# Patient Record
Sex: Female | Born: 1995 | Race: White | Hispanic: No | Marital: Married | Smoking: Never smoker
Health system: Southern US, Community
[De-identification: ages and names within clinical notes are randomized; demographics above are authoritative.]

## PROBLEM LIST (undated history)

## (undated) DIAGNOSIS — Z789 Other specified health status: Secondary | ICD-10-CM

## (undated) HISTORY — PX: NO PAST SURGERIES: SHX2092

## (undated) HISTORY — DX: Other specified health status: Z78.9

---

## 2018-01-10 ENCOUNTER — Encounter: Payer: Self-pay | Admitting: Women's Health

## 2018-01-10 ENCOUNTER — Ambulatory Visit: Payer: PRIVATE HEALTH INSURANCE | Admitting: Women's Health

## 2018-01-10 VITALS — BP 114/72 | HR 83 | Ht 63.0 in | Wt 207.2 lb

## 2018-01-10 DIAGNOSIS — Z3201 Encounter for pregnancy test, result positive: Secondary | ICD-10-CM

## 2018-01-10 DIAGNOSIS — Z3490 Encounter for supervision of normal pregnancy, unspecified, unspecified trimester: Secondary | ICD-10-CM | POA: Insufficient documentation

## 2018-01-10 DIAGNOSIS — Z3491 Encounter for supervision of normal pregnancy, unspecified, first trimester: Secondary | ICD-10-CM

## 2018-01-10 DIAGNOSIS — R112 Nausea with vomiting, unspecified: Secondary | ICD-10-CM | POA: Diagnosis not present

## 2018-01-10 LAB — POCT URINE PREGNANCY: Preg Test, Ur: POSITIVE — AB

## 2018-01-10 NOTE — Progress Notes (Signed)
   GYN VISIT Patient name: Miranda Campbell MRN [id number]  Date of birth: [birth year] Chief Complaint:   Possible Pregnancy  History of Present Illness:   Miranda Campbell is a 22 y.o. G1P0 Caucasian female at 849w4d by LMP of 11/11/17 being seen today for pregnancy confirmation. Came of COC's to become pregnant. Mild occ n/v. Taking pnv. No illicit drugs. No other meds.      Patient's last menstrual period was 11/11/2017 (exact date). Review of Systems:   Pertinent items are noted in HPI Denies fever/chills, dizziness, headaches, visual disturbances, fatigue, shortness of breath, chest pain, abdominal pain, vomiting, abnormal vaginal discharge/itching/odor/irritation, problems with periods, bowel movements, urination, or intercourse unless otherwise stated above.  Pertinent History Reviewed:  Reviewed past medical,surgical, social, obstetrical and family history.  Reviewed problem list, medications and allergies. Physical Assessment:   Vitals:   01/10/18 1421  BP: 114/72  Pulse: 83  Weight: 207 lb 3.2 oz (94 kg)  Height: 5\' 3"  (1.6 m)  Body mass index is 36.7 kg/m.       Physical Examination:   General appearance: alert, well appearing, and in no distress  Mental status: alert, oriented to person, place, and time  Skin: warm & dry   Cardiovascular: normal heart rate noted  Respiratory: normal respiratory effort, no distress  Abdomen: soft, non-tender   Pelvic: examination not indicated  Extremities: no edema   Results for orders placed or performed in visit on 01/10/18 (from the past 24 hour(s))  POCT urine pregnancy   Collection Time: 01/10/18  2:45 PM  Result Value Ref Range   Preg Test, Ur Positive (A) Negative    Assessment & Plan:  1) 3049w4d pregnant by LMP> dating u/s scheduled for 8/15 @ 9:30am at [facility], be there @ 9:15am. New ob packet given  Meds: No orders of the defined types were placed in this encounter.   Orders Placed This Encounter  Procedures  . US OB Comp Less  14 Wks  . POCT urine pregnancy    Return in about 2 weeks (around 01/24/2018) for intake w/ tish & new ob.  Cheral MarkerKimberly R Mari Battaglia CNM, [facility]NP[facility] 01/10/2018 2:45 PM

## 2018-01-10 NOTE — Patient Instructions (Addendum)
Miranda Campbell, I greatly value your feedback.  If you receive a survey following your visit with [place] today, we appreciate you taking the time to fill it out.  Thanks, [name], CNM, WHNP-BC  Ultrasound Magdalene Molly 8/15 @ 9:30am at [facility], be there at 9:15am   Nausea & Vomiting  Have saltine crackers or pretzels by your bed and eat a few bites before you raise your head out of bed in the morning  Eat small frequent meals throughout the day instead of large meals  Drink plenty of fluids throughout the day to stay hydrated, just don't drink a lot of fluids with your meals.  This can make your stomach fill up faster making you feel sick  Do not brush your teeth right after you eat  Products with real ginger are good for nausea, like ginger ale and ginger hard candy Make sure it says made with real ginger!  Sucking on sour candy like lemon heads is also good for nausea  If your prenatal vitamins make you nauseated, take them at night so you will sleep through the nausea  Sea Bands  If you feel like you need medicine for the nausea & vomiting please let us know  If you are unable to keep any fluids or food down please let us know   Constipation  Drink plenty of fluid, preferably water, throughout the day  Eat foods high in fiber such as fruits, vegetables, and grains  Exercise, such as walking, is a good way to keep your bowels regular  Drink warm fluids, especially warm prune juice, or decaf coffee  Eat a 1/2 cup of real oatmeal (not instant), 1/2 cup applesauce, and 1/2-1 cup warm prune juice every day  If needed, you may take Colace (docusate sodium) stool softener once or twice a day to help keep the stool soft. If you are pregnant, wait until you are out of your first trimester (12-14 weeks of pregnancy)  If you still are having problems with constipation, you may take Miralax once daily as needed to help keep your bowels regular.  If you are pregnant, wait until you are out  of your first trimester (12-14 weeks of pregnancy)   First Trimester of Pregnancy The first trimester of pregnancy is from week 1 until the end of week 12 (months 1 through 3). A week after a sperm fertilizes an egg, the egg will implant on the wall of the uterus. This embryo will begin to develop into a baby. Genes from you and your partner are forming the baby. The female genes determine whether the baby is a boy or a girl. At 6-8 weeks, the eyes and face are formed, and the heartbeat can be seen on ultrasound. At the end of 12 weeks, all the baby's organs are formed.  Now that you are pregnant, you will want to do everything you can to have a healthy baby. Two of the most important things are to get good prenatal care and to follow your health care provider's instructions. Prenatal care is all the medical care you receive before the baby's birth. This care will help prevent, find, and treat any problems during the pregnancy and childbirth. BODY CHANGES Your body goes through many changes during pregnancy. The changes vary from woman to woman.   You may gain or lose a couple of pounds at first.  You may feel sick to your stomach (nauseous) and throw up (vomit). If the vomiting is uncontrollable, call your health  care provider.  You may tire easily.  You may develop headaches that can be relieved by medicines approved by your health care provider.  You may urinate more often. Painful urination may mean you have a bladder infection.  You may develop heartburn as a result of your pregnancy.  You may develop constipation because certain hormones are causing the muscles that push waste through your intestines to slow down.  You may develop hemorrhoids or swollen, bulging veins (varicose veins).  Your breasts may begin to grow larger and become tender. Your nipples may stick out more, and the tissue that surrounds them (areola) may become darker.  Your gums may bleed and may be sensitive to  brushing and flossing.  Dark spots or blotches (chloasma, mask of pregnancy) may develop on your face. This will likely fade after the baby is born.  Your menstrual periods will stop.  You may have a loss of appetite.  You may develop cravings for certain kinds of food.  You may have changes in your emotions from day to day, such as being excited to be pregnant or being concerned that something may go wrong with the pregnancy and baby.  You may have more vivid and strange dreams.  You may have changes in your hair. These can include thickening of your hair, rapid growth, and changes in texture. Some women also have hair loss during or after pregnancy, or hair that feels dry or thin. Your hair will most likely return to normal after your baby is born. WHAT TO EXPECT AT YOUR PRENATAL VISITS During a routine prenatal visit:  You will be weighed to make sure you and the baby are growing normally.  Your blood pressure will be taken.  Your abdomen will be measured to track your baby's growth.  The fetal heartbeat will be listened to starting around week 10 or 12 of your pregnancy.  Test results from any previous visits will be discussed. Your health care provider may ask you:  How you are feeling.  If you are feeling the baby move.  If you have had any abnormal symptoms, such as leaking fluid, bleeding, severe headaches, or abdominal cramping.  If you have any questions. Other tests that may be performed during your first trimester include:  Blood tests to find your blood type and to check for the presence of any previous infections. They will also be used to check for low iron levels (anemia) and Rh antibodies. Later in the pregnancy, blood tests for diabetes will be done along with other tests if problems develop.  Urine tests to check for infections, diabetes, or protein in the urine.  An ultrasound to confirm the proper growth and development of the baby.  An amniocentesis  to check for possible genetic problems.  Fetal screens for spina bifida and Down syndrome.  You may need other tests to make sure you and the baby are doing well. HOME CARE INSTRUCTIONS  Medicines  Follow your health care provider's instructions regarding medicine use. Specific medicines may be either safe or unsafe to take during pregnancy.  Take your prenatal vitamins as directed.  If you develop constipation, try taking a stool softener if your health care provider approves. Diet  Eat regular, well-balanced meals. Choose a variety of foods, such as meat or vegetable-based protein, fish, milk and low-fat dairy products, vegetables, fruits, and whole grain breads and cereals. Your health care provider will help you determine the amount of weight gain that is right for you.  Avoid raw meat and uncooked cheese. These carry germs that can cause birth defects in the baby.  Eating four or five small meals rather than three large meals a day may help relieve nausea and vomiting. If you start to feel nauseous, eating a few soda crackers can be helpful. Drinking liquids between meals instead of during meals also seems to help nausea and vomiting.  If you develop constipation, eat more high-fiber foods, such as fresh vegetables or fruit and whole grains. Drink enough fluids to keep your urine clear or pale yellow. Activity and Exercise  Exercise only as directed by your health care provider. Exercising will help you:  Control your weight.  Stay in shape.  Be prepared for labor and delivery.  Experiencing pain or cramping in the lower abdomen or low back is a good sign that you should stop exercising. Check with your health care provider before continuing normal exercises.  Try to avoid standing for long periods of time. Move your legs often if you must stand in one place for a long time.  Avoid heavy lifting.  Wear low-heeled shoes, and practice good posture.  You may continue to have  sex unless your health care provider directs you otherwise. Relief of Pain or Discomfort  Wear a good support bra for breast tenderness.   Take warm sitz baths to soothe any pain or discomfort caused by hemorrhoids. Use hemorrhoid cream if your health care provider approves.   Rest with your legs elevated if you have leg cramps or low back pain.  If you develop varicose veins in your legs, wear support hose. Elevate your feet for 15 minutes, 3-4 times a day. Limit salt in your diet. Prenatal Care  Schedule your prenatal visits by the twelfth week of pregnancy. They are usually scheduled monthly at first, then more often in the last 2 months before delivery.  Write down your questions. Take them to your prenatal visits.  Keep all your prenatal visits as directed by your health care provider. Safety  Wear your seat belt at all times when driving.  Make a list of emergency phone numbers, including numbers for family, friends, the hospital, and police and fire departments. General Tips  Ask your health care provider for a referral to a local prenatal education class. Begin classes no later than at the beginning of month 6 of your pregnancy.  Ask for help if you have counseling or nutritional needs during pregnancy. Your health care provider can offer advice or refer you to specialists for help with various needs.  Do not use hot tubs, steam rooms, or saunas.  Do not douche or use tampons or scented sanitary pads.  Do not cross your legs for long periods of time.  Avoid cat litter boxes and soil used by cats. These carry germs that can cause birth defects in the baby and possibly loss of the fetus by miscarriage or stillbirth.  Avoid all smoking, herbs, alcohol, and medicines not prescribed by your health care provider. Chemicals in these affect the formation and growth of the baby.  Schedule a dentist appointment. At home, brush your teeth with a soft toothbrush and be gentle when  you floss. SEEK MEDICAL CARE IF:   You have dizziness.  You have mild pelvic cramps, pelvic pressure, or nagging pain in the abdominal area.  You have persistent nausea, vomiting, or diarrhea.  You have a bad smelling vaginal discharge.  You have pain with urination.  You notice increased swelling in  your face, hands, legs, or ankles. SEEK IMMEDIATE MEDICAL CARE IF:   You have a fever.  You are leaking fluid from your vagina.  You have spotting or bleeding from your vagina.  You have severe abdominal cramping or pain.  You have rapid weight gain or loss.  You vomit blood or material that looks like coffee grounds.  You are exposed to [place] measles and have never had them.  You are exposed to fifth disease or chickenpox.  You develop a severe headache.  You have shortness of breath.  You have any kind of trauma, such as from a fall or a car accident. Document Released: 05/16/2001 Document Revised: 10/06/2013 Document Reviewed: 04/01/2013 [employer] Patient Information 2015 [place], [place]. This information is not intended to replace advice given to you by your health care provider. Make sure you discuss any questions you have with your health care provider.

## 2018-01-17 ENCOUNTER — Ambulatory Visit (HOSPITAL_COMMUNITY)
Admission: RE | Admit: 2018-01-17 | Discharge: 2018-01-17 | Disposition: A | Discharge status: Home / Self Care | Payer: PRIVATE HEALTH INSURANCE | Source: Ambulatory Visit | Attending: Women's Health | Admitting: Women's Health

## 2018-01-17 DIAGNOSIS — Z3491 Encounter for supervision of normal pregnancy, unspecified, first trimester: Secondary | ICD-10-CM | POA: Diagnosis not present

## 2018-01-28 ENCOUNTER — Ambulatory Visit (INDEPENDENT_AMBULATORY_CARE_PROVIDER_SITE_OTHER): Payer: PRIVATE HEALTH INSURANCE | Admitting: Women's Health

## 2018-01-28 ENCOUNTER — Ambulatory Visit: Payer: PRIVATE HEALTH INSURANCE | Admitting: *Deleted

## 2018-01-28 ENCOUNTER — Encounter: Payer: Self-pay | Admitting: Women's Health

## 2018-01-28 VITALS — BP 119/75 | HR 75 | Wt 201.5 lb

## 2018-01-28 DIAGNOSIS — F419 Anxiety disorder, unspecified: Secondary | ICD-10-CM

## 2018-01-28 DIAGNOSIS — Z3A11 11 weeks gestation of pregnancy: Secondary | ICD-10-CM

## 2018-01-28 DIAGNOSIS — Z1389 Encounter for screening for other disorder: Secondary | ICD-10-CM

## 2018-01-28 DIAGNOSIS — Z3401 Encounter for supervision of normal first pregnancy, first trimester: Secondary | ICD-10-CM

## 2018-01-28 DIAGNOSIS — Z8279 Family history of other congenital malformations, deformations and chromosomal abnormalities: Secondary | ICD-10-CM

## 2018-01-28 DIAGNOSIS — Z3400 Encounter for supervision of normal first pregnancy, unspecified trimester: Secondary | ICD-10-CM | POA: Insufficient documentation

## 2018-01-28 DIAGNOSIS — Z3682 Encounter for antenatal screening for nuchal translucency: Secondary | ICD-10-CM

## 2018-01-28 DIAGNOSIS — Z331 Pregnant state, incidental: Secondary | ICD-10-CM

## 2018-01-28 LAB — POCT URINALYSIS DIPSTICK OB
GLUCOSE, UA: NEGATIVE — AB
Ketones, UA: NEGATIVE
Leukocytes, UA: NEGATIVE
Nitrite, UA: NEGATIVE
POC,PROTEIN,UA: NEGATIVE
RBC UA: NEGATIVE

## 2018-01-28 NOTE — Progress Notes (Signed)
INITIAL OBSTETRICAL VISIT Patient name: Miranda Campbell MRN [id number]  Date of birth: 02-21-1996 Chief Complaint:   Initial Prenatal Visit  History of Present Illness:   Miranda Campbell is a 22 y.o. G1P0 Caucasian female at [redacted]w[redacted]d by LMP c/w outside 8wk u/s, with an Estimated Date of Delivery: 08/18/18 being seen today for her initial obstetrical visit.   Her obstetrical history is significant for primigravida.   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 Hip does not pop out of joint, just pops occ when walking.  FOB's 1st cousin died w/ Fragile X H/O anxiety- no meds, doing well Patient's last menstrual period was 11/11/2017 (exact date). Last pap 2018 [place]. Results were: normal Review of Systems:   Pertinent items are noted in HPI Denies cramping/contractions, leakage of fluid, vaginal bleeding, abnormal vaginal discharge w/ itching/odor/irritation, headaches, visual changes, shortness of breath, chest pain, abdominal pain, severe nausea/vomiting, or problems with urination or bowel movements unless otherwise stated above.  Pertinent History Reviewed:  Reviewed past medical,surgical, social, obstetrical and family history.  Reviewed problem list, medications and allergies. OB History  Gravida Para Term Preterm AB Living  1            SAB TAB Ectopic Multiple Live Births               # Outcome Date GA Lbr Len/2nd Weight Sex Delivery Anes PTL Lv  1 Current            Physical Assessment:   Vitals:   01/28/18 1509  BP: 119/75  Pulse: 75  Weight: 201 lb 8 oz (91.4 kg)  Body mass index is 35.69 kg/m.       Physical Examination:  General appearance - well appearing, and in no distress  Mental status - alert, oriented to person, place, and time  Psych:  She has a normal mood and affect  Skin - warm and dry, normal color, no suspicious lesions noted  Chest - effort normal, all lung fields clear to auscultation  bilaterally  Heart - normal rate and regular rhythm  Abdomen - soft, nontender  Extremities:  No swelling or varicosities noted  Thin prep pap is not done    Fetal Heart Rate (bpm): +u/s via informal transabdominal u/s  Results for orders placed or performed in visit on 01/28/18 (from the past 24 hour(s))  POC Urinalysis Dipstick OB   Collection Time: 01/28/18  3:09 PM  Result Value Ref Range   Color, UA     Clarity, UA     Glucose, UA Negative (A) (none)   Bilirubin, UA     Ketones, UA neg    Spec Grav, UA     Blood, UA neg    pH, UA     POC Protein UA Negative Negative, Trace   Urobilinogen, UA     Nitrite, UA neg    Leukocytes, UA Negative Negative   Appearance     Odor      Assessment & Plan:  1) Low-Risk Pregnancy G1P0 at [redacted]w[redacted]d with an Estimated Date of Delivery: 08/18/18   2) Initial OB visit  3) FOB's 1st cousin died w/ Fragile X> offered pt testing, wants to think/check insurance, will let us know  4) Anxiety> no meds, doing well  Meds: No orders of the defined types were placed in this encounter.  Initial labs obtained Continue prenatal vitamins Reviewed n/v relief measures and warning s/s to report  Reviewed recommended weight gain based on pre-gravid BMI Encouraged well-balanced diet Genetic Screening discussed Integrated Screen: requested Cystic fibrosis screening discussed declined Ultrasound discussed; fetal survey: requested CCNC completed>not applying for preg mcaid  Follow-up: Return in about 2 weeks (around 02/11/2018) for US:NT+1stIT, LROB.   Orders Placed This Encounter  Procedures  . GC/Chlamydia Probe Amp  . Urine Culture  . US Fetal Nuchal Translucency Measurement  . Urinalysis, Routine w reflex microscopic  . Obstetric Panel, Including HIV  . Pain Management Screening Profile (10S)  . POC Urinalysis Dipstick OB    Cheral MarkerKimberly R Tzippy Testerman CNM, [facility]NP-BC 01/28/2018 4:01 PM

## 2018-01-28 NOTE — Patient Instructions (Signed)
Miranda PaganEmily Minerva, I greatly value your feedback.  If you receive a survey following your visit with us today, we appreciate you taking the time to fill it out.  Thanks, Joellyn HaffKim Berenice Oehlert, CNM, WHNP-BC   Nausea & Vomiting  Have saltine crackers or pretzels by your bed and eat a few bites before you raise your head out of bed in the morning  Eat small frequent meals throughout the day instead of large meals  Drink plenty of fluids throughout the day to stay hydrated, just don't drink a lot of fluids with your meals.  This can make your stomach fill up faster making you feel sick  Do not brush your teeth right after you eat  Products with real ginger are good for nausea, like ginger ale and ginger hard candy Make sure it says made with real ginger!  Sucking on sour candy like lemon heads is also good for nausea  If your prenatal vitamins make you nauseated, take them at night so you will sleep through the nausea  Sea Bands  If you feel like you need medicine for the nausea & vomiting please let us know  If you are unable to keep any fluids or food down please let us know   Constipation  Drink plenty of fluid, preferably water, throughout the day  Eat foods high in fiber such as fruits, vegetables, and grains  Exercise, such as walking, is a good way to keep your bowels regular  Drink warm fluids, especially warm prune juice, or decaf coffee  Eat a 1/2 cup of real oatmeal (not instant), 1/2 cup applesauce, and 1/2-1 cup warm prune juice every day  If needed, you may take Colace (docusate sodium) stool softener once or twice a day to help keep the stool soft. If you are pregnant, wait until you are out of your first trimester (12-14 weeks of pregnancy)  If you still are having problems with constipation, you may take Miralax once daily as needed to help keep your bowels regular.  If you are pregnant, wait until you are out of your first trimester (12-14 weeks of pregnancy)   First  Trimester of Pregnancy The first trimester of pregnancy is from week 1 until the end of week 12 (months 1 through 3). A week after a sperm fertilizes an egg, the egg will implant on the wall of the uterus. This embryo will begin to develop into a baby. Genes from you and your partner are forming the baby. The female genes determine whether the baby is a boy or a girl. At 6-8 weeks, the eyes and face are formed, and the heartbeat can be seen on ultrasound. At the end of 12 weeks, all the baby's organs are formed.  Now that you are pregnant, you will want to do everything you can to have a healthy baby. Two of the most important things are to get good prenatal care and to follow your health care provider's instructions. Prenatal care is all the medical care you receive before the baby's birth. This care will help prevent, find, and treat any problems during the pregnancy and childbirth. BODY CHANGES Your body goes through many changes during pregnancy. The changes vary from woman to woman.   You may gain or lose a couple of pounds at first.  You may feel sick to your stomach (nauseous) and throw up (vomit). If the vomiting is uncontrollable, call your health care provider.  You may tire easily.  You may develop headaches that  can be relieved by medicines approved by your health care provider.  You may urinate more often. Painful urination may mean you have a bladder infection.  You may develop heartburn as a result of your pregnancy.  You may develop constipation because certain hormones are causing the muscles that push waste through your intestines to slow down.  You may develop hemorrhoids or swollen, bulging veins (varicose veins).  Your breasts may begin to grow larger and become tender. Your nipples may stick out more, and the tissue that surrounds them (areola) may become darker.  Your gums may bleed and may be sensitive to brushing and flossing.  Dark spots or blotches (chloasma, mask  of pregnancy) may develop on your face. This will likely fade after the baby is born.  Your menstrual periods will stop.  You may have a loss of appetite.  You may develop cravings for certain kinds of food.  You may have changes in your emotions from day to day, such as being excited to be pregnant or being concerned that something may go wrong with the pregnancy and baby.  You may have more vivid and strange dreams.  You may have changes in your hair. These can include thickening of your hair, rapid growth, and changes in texture. Some women also have hair loss during or after pregnancy, or hair that feels dry or thin. Your hair will most likely return to normal after your baby is born. WHAT TO EXPECT AT YOUR PRENATAL VISITS During a routine prenatal visit:  You will be weighed to make sure you and the baby are growing normally.  Your blood pressure will be taken.  Your abdomen will be measured to track your baby's growth.  The fetal heartbeat will be listened to starting around week 10 or 12 of your pregnancy.  Test results from any previous visits will be discussed. Your health care provider may ask you:  How you are feeling.  If you are feeling the baby move.  If you have had any abnormal symptoms, such as leaking fluid, bleeding, severe headaches, or abdominal cramping.  If you have any questions. Other tests that may be performed during your first trimester include:  Blood tests to find your blood type and to check for the presence of any previous infections. They will also be used to check for low iron levels (anemia) and Rh antibodies. Later in the pregnancy, blood tests for diabetes will be done along with other tests if problems develop.  Urine tests to check for infections, diabetes, or protein in the urine.  An ultrasound to confirm the proper growth and development of the baby.  An amniocentesis to check for possible genetic problems.  Fetal screens for spina  bifida and Down syndrome.  You may need other tests to make sure you and the baby are doing well. HOME CARE INSTRUCTIONS  Medicines  Follow your health care provider's instructions regarding medicine use. Specific medicines may be either safe or unsafe to take during pregnancy.  Take your prenatal vitamins as directed.  If you develop constipation, try taking a stool softener if your health care provider approves. Diet  Eat regular, well-balanced meals. Choose a variety of foods, such as meat or vegetable-based protein, fish, milk and low-fat dairy products, vegetables, fruits, and whole grain breads and cereals. Your health care provider will help you determine the amount of weight gain that is right for you.  Avoid raw meat and uncooked cheese. These carry germs that can cause  birth defects in the baby.  Eating four or five small meals rather than three large meals a day may help relieve nausea and vomiting. If you start to feel nauseous, eating a few soda crackers can be helpful. Drinking liquids between meals instead of during meals also seems to help nausea and vomiting.  If you develop constipation, eat more high-fiber foods, such as fresh vegetables or fruit and whole grains. Drink enough fluids to keep your urine clear or pale yellow. Activity and Exercise  Exercise only as directed by your health care provider. Exercising will help you:  Control your weight.  Stay in shape.  Be prepared for labor and delivery.  Experiencing pain or cramping in the lower abdomen or low back is a good sign that you should stop exercising. Check with your health care provider before continuing normal exercises.  Try to avoid standing for long periods of time. Move your legs often if you must stand in one place for a long time.  Avoid heavy lifting.  Wear low-heeled shoes, and practice good posture.  You may continue to have sex unless your health care provider directs you  otherwise. Relief of Pain or Discomfort  Wear a good support bra for breast tenderness.   Take warm sitz baths to soothe any pain or discomfort caused by hemorrhoids. Use hemorrhoid cream if your health care provider approves.   Rest with your legs elevated if you have leg cramps or low back pain.  If you develop varicose veins in your legs, wear support hose. Elevate your feet for 15 minutes, 3-4 times a day. Limit salt in your diet. Prenatal Care  Schedule your prenatal visits by the twelfth week of pregnancy. They are usually scheduled monthly at first, then more often in the last 2 months before delivery.  Write down your questions. Take them to your prenatal visits.  Keep all your prenatal visits as directed by your health care provider. Safety  Wear your seat belt at all times when driving.  Make a list of emergency phone numbers, including numbers for family, friends, the hospital, and police and fire departments. General Tips  Ask your health care provider for a referral to a local prenatal education class. Begin classes no later than at the beginning of month 6 of your pregnancy.  Ask for help if you have counseling or nutritional needs during pregnancy. Your health care provider can offer advice or refer you to specialists for help with various needs.  Do not use hot tubs, steam rooms, or saunas.  Do not douche or use tampons or scented sanitary pads.  Do not cross your legs for long periods of time.  Avoid cat litter boxes and soil used by cats. These carry germs that can cause birth defects in the baby and possibly loss of the fetus by miscarriage or stillbirth.  Avoid all smoking, herbs, alcohol, and medicines not prescribed by your health care provider. Chemicals in these affect the formation and growth of the baby.  Schedule a dentist appointment. At home, brush your teeth with a soft toothbrush and be gentle when you floss. SEEK MEDICAL CARE IF:   You have  dizziness.  You have mild pelvic cramps, pelvic pressure, or nagging pain in the abdominal area.  You have persistent nausea, vomiting, or diarrhea.  You have a bad smelling vaginal discharge.  You have pain with urination.  You notice increased swelling in your face, hands, legs, or ankles. SEEK IMMEDIATE MEDICAL CARE IF:  You have a fever.  You are leaking fluid from your vagina.  You have spotting or bleeding from your vagina.  You have severe abdominal cramping or pain.  You have rapid weight gain or loss.  You vomit blood or material that looks like coffee grounds.  You are exposed to [place] measles and have never had them.  You are exposed to fifth disease or chickenpox.  You develop a severe headache.  You have shortness of breath.  You have any kind of trauma, such as from a fall or a car accident. Document Released: 05/16/2001 Document Revised: 10/06/2013 Document Reviewed: 04/01/2013 [facility] Patient Information 2015 [place], [place]. This information is not intended to replace advice given to you by your health care provider. Make sure you discuss any questions you have with your health care provider.

## 2018-01-29 LAB — PMP SCREEN PROFILE (10S), URINE
AMPHETAMINE SCREEN URINE: NEGATIVE ng/mL
BARBITURATE SCREEN URINE: NEGATIVE ng/mL
BENZODIAZEPINE SCREEN, URINE: NEGATIVE ng/mL
CANNABINOIDS UR QL SCN: NEGATIVE ng/mL
COCAINE(METAB.)SCREEN, URINE: NEGATIVE ng/mL
Creatinine(Crt), U: 29.9 mg/dL (ref 20.0–300.0)
Methadone Screen, Urine: NEGATIVE ng/mL
OPIATE SCREEN URINE: NEGATIVE ng/mL
OXYCODONE+OXYMORPHONE UR QL SCN: NEGATIVE ng/mL
PHENCYCLIDINE QUANTITATIVE URINE: NEGATIVE ng/mL
Ph of Urine: 7.1 (ref 4.5–8.9)
Propoxyphene Scrn, Ur: NEGATIVE ng/mL

## 2018-01-30 LAB — OBSTETRIC PANEL, INCLUDING HIV
Antibody Screen: NEGATIVE
BASOS ABS: 0 10*3/uL (ref 0.0–0.2)
Basos: 0 %
EOS (ABSOLUTE): 0 10*3/uL (ref 0.0–0.4)
Eos: 0 %
HIV SCREEN 4TH GENERATION: NONREACTIVE
Hematocrit: 41.6 % (ref 34.0–46.6)
Hemoglobin: 14.3 g/dL (ref 11.1–15.9)
Hepatitis B Surface Ag: NEGATIVE
IMMATURE GRANULOCYTES: 0 %
Immature Grans (Abs): 0 10*3/uL (ref 0.0–0.1)
Lymphocytes Absolute: 1.9 10*3/uL (ref 0.7–3.1)
Lymphs: 16 %
MCH: 30.5 pg (ref 26.6–33.0)
MCHC: 34.4 g/dL (ref 31.5–35.7)
MCV: 89 fL (ref 79–97)
Monocytes Absolute: 0.7 10*3/uL (ref 0.1–0.9)
Monocytes: 6 %
NEUTROS ABS: 9.3 10*3/uL — AB (ref 1.4–7.0)
NEUTROS PCT: 78 %
PLATELETS: 325 10*3/uL (ref 150–450)
RBC: 4.69 x10E6/uL (ref 3.77–5.28)
RDW: 13.6 % (ref 12.3–15.4)
RPR Ser Ql: NONREACTIVE
Rh Factor: POSITIVE
Rubella Antibodies, IGG: 1.24 index (ref >0.99)
WBC: 12 10*3/uL — AB (ref 3.4–10.8)

## 2018-01-30 LAB — URINE CULTURE

## 2018-01-30 LAB — URINALYSIS, ROUTINE W REFLEX MICROSCOPIC
Bilirubin, UA: NEGATIVE
Glucose, UA: NEGATIVE
KETONES UA: NEGATIVE
Leukocytes, UA: NEGATIVE
NITRITE UA: NEGATIVE
Protein, UA: NEGATIVE
RBC UA: NEGATIVE
SPEC GRAV UA: 1.006 (ref 1.005–1.030)
Urobilinogen, Ur: 0.2 mg/dL (ref 0.2–1.0)
pH, UA: 7 (ref 5.0–7.5)

## 2018-01-30 LAB — GC/CHLAMYDIA PROBE AMP
Chlamydia trachomatis, NAA: NEGATIVE
Neisseria gonorrhoeae by PCR: NEGATIVE

## 2018-02-13 ENCOUNTER — Ambulatory Visit (INDEPENDENT_AMBULATORY_CARE_PROVIDER_SITE_OTHER): Payer: PRIVATE HEALTH INSURANCE | Admitting: Obstetrics and Gynecology

## 2018-02-13 ENCOUNTER — Other Ambulatory Visit: Payer: Self-pay

## 2018-02-13 ENCOUNTER — Encounter: Payer: Self-pay | Admitting: Obstetrics and Gynecology

## 2018-02-13 ENCOUNTER — Ambulatory Visit (INDEPENDENT_AMBULATORY_CARE_PROVIDER_SITE_OTHER): Payer: PRIVATE HEALTH INSURANCE

## 2018-02-13 VITALS — BP 122/73 | HR 70 | Wt 198.8 lb

## 2018-02-13 DIAGNOSIS — Z3A13 13 weeks gestation of pregnancy: Secondary | ICD-10-CM

## 2018-02-13 DIAGNOSIS — Z3682 Encounter for antenatal screening for nuchal translucency: Secondary | ICD-10-CM | POA: Diagnosis not present

## 2018-02-13 DIAGNOSIS — Z1389 Encounter for screening for other disorder: Secondary | ICD-10-CM

## 2018-02-13 DIAGNOSIS — Z3401 Encounter for supervision of normal first pregnancy, first trimester: Secondary | ICD-10-CM

## 2018-02-13 DIAGNOSIS — Z331 Pregnant state, incidental: Secondary | ICD-10-CM

## 2018-02-13 LAB — POCT URINALYSIS DIPSTICK OB
Blood, UA: NEGATIVE
Glucose, UA: NEGATIVE
Ketones, UA: NEGATIVE
LEUKOCYTES UA: NEGATIVE
NITRITE UA: NEGATIVE
PROTEIN: NEGATIVE

## 2018-02-13 NOTE — Progress Notes (Signed)
Patient ID: [name], female   DOB: August 19, 1995, 22 y.o.   MRN: [id number]    LOW-RISK PREGNANCY VISIT Patient name: Miranda Campbell MRN [id number]  Date of birth: 1996/02/16 Chief Complaint:   Routine Prenatal Visit (u/s today)  History of Present Illness:   Miranda Campbell is a 22 y.o. G1P0 female at [redacted]w[redacted]d with an Estimated Date of Delivery: 08/18/18 being seen today for ongoing management of a low-risk pregnancy. 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 Today she reports no complaints.  .  .   . denies leaking of fluid. Review of Systems:   Pertinent items are noted in HPI Denies abnormal vaginal discharge w/ itching/odor/irritation, headaches, visual changes, shortness of breath, chest pain, abdominal pain, severe nausea/vomiting, or problems with urination or bowel movements unless otherwise stated above. Pertinent History Reviewed:  Reviewed past medical,surgical, social, obstetrical and family history.  Reviewed problem list, medications and allergies. Physical Assessment:  There were no vitals filed for this visit.There is no height or weight on file to calculate BMI.        Physical Examination:   General appearance: Well appearing, and in no distress  Mental status: Alert, oriented to person, place, and time  Skin: Warm & dry  Cardiovascular: Normal heart rate noted  Respiratory: Normal respiratory effort, no distress  Abdomen: Soft, gravid, nontender  Pelvic: Cervical exam deferred         Extremities:    Fetal Status:          No results found for this or any previous visit (from the past 24 hour(s)).  Assessment & Plan:  1) Low-risk pregnancy G1P0 at [redacted]w[redacted]d with an Estimated Date of Delivery: 08/18/18    Meds: No orders of the defined types were placed in this encounter.  Labs/procedures today: u/s  Plan:  Continue routine obstetrical care 4 weeks for routine LROB visit  Follow-up: No follow-ups on file.  Orders Placed This Encounter  Procedures  . POC  Urinalysis Dipstick OB   By signing my name below, I, Arnette Norris, attest that this documentation has been prepared under the direction and in the presence of Tilda Burrow, MD. Electronically Signed: Arnette Norris Medical Scribe. 02/13/18. 3:34 PM.  I personally performed the services described in this documentation, which was SCRIBED in my presence. The recorded information has been reviewed and considered accurate. It has been edited as necessary during review. Tilda Burrow, MD

## 2018-02-13 NOTE — Progress Notes (Signed)
[place] 12+3 wks,measurements c/w dates,crl 63.92 mm,normal ovaries bilat,NB present,NT 1.8 mm,fhr 159 bpm

## 2018-02-15 LAB — INTEGRATED 1
CROWN RUMP LENGTH MAT SCREEN: 63.9 mm
Gest. Age on Collection Date: 12.6 weeks
Maternal Age at EDD: 23.1 yr
Nuchal Translucency (NT): 1.8 mm
Number of Fetuses: 1
PAPP-A Value: 896.2 ng/mL
Weight: 199 [lb_av]

## 2018-03-13 ENCOUNTER — Encounter: Payer: Self-pay | Admitting: Obstetrics and Gynecology

## 2018-03-13 ENCOUNTER — Other Ambulatory Visit: Payer: Self-pay

## 2018-03-13 ENCOUNTER — Ambulatory Visit (INDEPENDENT_AMBULATORY_CARE_PROVIDER_SITE_OTHER): Payer: PRIVATE HEALTH INSURANCE | Admitting: Obstetrics and Gynecology

## 2018-03-13 VITALS — BP 125/75 | HR 90 | Wt 195.8 lb

## 2018-03-13 DIAGNOSIS — Z331 Pregnant state, incidental: Secondary | ICD-10-CM

## 2018-03-13 DIAGNOSIS — Z1389 Encounter for screening for other disorder: Secondary | ICD-10-CM

## 2018-03-13 DIAGNOSIS — Z3402 Encounter for supervision of normal first pregnancy, second trimester: Secondary | ICD-10-CM

## 2018-03-13 DIAGNOSIS — Z3A16 16 weeks gestation of pregnancy: Secondary | ICD-10-CM

## 2018-03-13 DIAGNOSIS — Z1379 Encounter for other screening for genetic and chromosomal anomalies: Secondary | ICD-10-CM

## 2018-03-13 LAB — POCT URINALYSIS DIPSTICK OB
Glucose, UA: NEGATIVE
Ketones, UA: NEGATIVE
LEUKOCYTES UA: NEGATIVE
NITRITE UA: NEGATIVE
PROTEIN: NEGATIVE
RBC UA: NEGATIVE

## 2018-03-13 NOTE — Progress Notes (Signed)
Patient ID: Miranda Campbell, female   DOB: September 02, 1995, 22 y.o.   MRN: [id number]    LOW-RISK PREGNANCY VISIT Patient name: Miranda Campbell MRN [id number]  Date of birth: 04-Dec-1995 Chief Complaint:   Routine Prenatal Visit (2nd IT)  History of Present Illness:   Miranda Campbell is a 22 y.o. G1P0 female at [redacted]w[redacted]d with an Estimated Date of Delivery: 08/25/18 being seen today for ongoing management of a low-risk pregnancy.  Today she reports no complaints.  . Vag. Bleeding: None.   . denies leaking of fluid. Review of Systems:   Pertinent items are noted in HPI Denies abnormal vaginal discharge w/ itching/odor/irritation, headaches, visual changes, shortness of breath, chest pain, abdominal pain, severe nausea/vomiting, or problems with urination or bowel movements unless otherwise stated above. Pertinent History Reviewed:  Reviewed past medical,surgical, social, obstetrical and family history.  Reviewed problem list, medications and allergies. Physical Assessment:   Vitals:   03/13/18 1501  BP: 125/75  Pulse: 90  Weight: 195 lb 12.8 oz (88.8 kg)  Body mass index is 34.68 kg/m.        Physical Examination:   General appearance: Well appearing, and in no distress  Mental status: Alert, oriented to person, place, and time  Skin: Warm & dry  Cardiovascular: Normal heart rate noted  Respiratory: Normal respiratory effort, no distress  Abdomen: Soft, gravid, nontender  Pelvic: Cervical exam performed         Extremities: Edema: None  Fetal Status: Fetal Heart Rate (bpm): 150        Results for orders placed or performed in visit on 03/13/18 (from the past 24 hour(s))  POC Urinalysis Dipstick OB   Collection Time: 03/13/18  3:00 PM  Result Value Ref Range   Color, UA     Clarity, UA     Glucose, UA Negative Negative   Bilirubin, UA     Ketones, UA neg    Spec Grav, UA     Blood, UA neg    pH, UA     POC Protein UA Negative Negative, Trace   Urobilinogen, UA     Nitrite, UA neg     Leukocytes, UA Negative Negative   Appearance     Odor      Assessment & Plan:  1) Low-risk pregnancy G1P0 at [redacted]w[redacted]d with an Estimated Date of Delivery: 08/25/18     Meds: No orders of the defined types were placed in this encounter.  Labs/procedures today: 2nd ITs  Plan:   1. Continue routine obstetrical care  2. F/u in 4 weeks for routine LROB  Follow-up: No follow-ups on file.  Orders Placed This Encounter  Procedures  . INTEGRATED 2  . POC Urinalysis Dipstick OB   By signing my name below, I, Arnette Norris, attest that this documentation has been prepared under the direction and in the presence of Tilda Burrow, MD. Electronically Signed: Arnette Norris Medical Scribe. 03/13/18. 3:23 PM.  I personally performed the services described in this documentation, which was SCRIBED in my presence. The recorded information has been reviewed and considered accurate. It has been edited as necessary during review. Tilda Burrow, MD

## 2018-03-15 LAB — INTEGRATED 2
AFP MARKER: 30.1 ng/mL
AFP MOM: 1.1
Crown Rump Length: 63.9 mm
DIA MOM: 0.67
DIA Value: 93.6 pg/mL
ESTRIOL UNCONJUGATED: 1.21 ng/mL
Gest. Age on Collection Date: 12.6 weeks
Gestational Age: 16.6 weeks
MATERNAL AGE AT EDD: 23.1 a
NUMBER OF FETUSES: 1
Nuchal Translucency (NT): 1.8 mm
Nuchal Translucency MoM: 1.26
PAPP-A MoM: 1.32
PAPP-A Value: 896.2 ng/mL
Test Results:: NEGATIVE
WEIGHT: 199 [lb_av]
Weight: 199 [lb_av]
hCG MoM: 0.52
hCG Value: 13.8 IU/mL
uE3 MoM: 1.33

## 2018-04-10 ENCOUNTER — Ambulatory Visit (INDEPENDENT_AMBULATORY_CARE_PROVIDER_SITE_OTHER): Payer: PRIVATE HEALTH INSURANCE | Admitting: Advanced Practice Midwife

## 2018-04-10 VITALS — BP 110/69 | HR 78 | Wt 198.0 lb

## 2018-04-10 DIAGNOSIS — Z331 Pregnant state, incidental: Secondary | ICD-10-CM

## 2018-04-10 DIAGNOSIS — Z3A20 20 weeks gestation of pregnancy: Secondary | ICD-10-CM

## 2018-04-10 DIAGNOSIS — Z1389 Encounter for screening for other disorder: Secondary | ICD-10-CM

## 2018-04-10 DIAGNOSIS — Z3402 Encounter for supervision of normal first pregnancy, second trimester: Secondary | ICD-10-CM

## 2018-04-10 DIAGNOSIS — Z363 Encounter for antenatal screening for malformations: Secondary | ICD-10-CM

## 2018-04-10 LAB — POCT URINALYSIS DIPSTICK OB
Blood, UA: NEGATIVE
Glucose, UA: NEGATIVE
Ketones, UA: NEGATIVE
LEUKOCYTES UA: NEGATIVE
Nitrite, UA: NEGATIVE
PROTEIN: NEGATIVE

## 2018-04-10 NOTE — Progress Notes (Signed)
   LOW-RISK PREGNANCY VISIT Patient name: Miranda Campbell MRN [id number]  Date of birth: 07-27-1995 Chief Complaint:   Routine Prenatal Visit  History of Present Illness:   Miranda Campbell is a 22 y.o. G1P0 female at [redacted]w[redacted]d with an Estimated Date of Delivery: 08/25/18 being seen today for ongoing management of a low-risk pregnancy.  Today she reports no complaints. Contractions: Not present. Vag. Bleeding: None.  Movement: Absent. denies leaking of fluid. Review of Systems:   Pertinent items are noted in HPI Denies abnormal vaginal discharge w/ itching/odor/irritation, headaches, visual changes, shortness of breath, chest pain, abdominal pain, severe nausea/vomiting, or problems with urination or bowel movements unless otherwise stated above.  Pertinent History Reviewed:  Medical & Surgical Hx:   Past Medical History:  Diagnosis Date  . Medical history non-contributory    Past Surgical History:  Procedure Laterality Date  . NO PAST SURGERIES     Family History  Problem Relation Age of Onset  . Spina bifida Mother     Current Outpatient Medications:  .  Prenatal Vit-DSS-Fe Cbn-FA (PRENATAL AD PO), Take by mouth., Disp: , Rfl:  Social History: Reviewed -  reports that she has never smoked. She has never used smokeless tobacco.  Physical Assessment:   Vitals:   04/10/18 1452  BP: 110/69  Pulse: 78  Weight: 198 lb (89.8 kg)  Body mass index is 35.07 kg/m.        Physical Examination:   General appearance: Well appearing, and in no distress  Mental status: Alert, oriented to person, place, and time  Skin: Warm & dry  Cardiovascular: Normal heart rate noted  Respiratory: Normal respiratory effort, no distress  Abdomen: Soft, gravid, nontender  Pelvic: Cervical exam deferred         Extremities: Edema: None  Fetal Status:     Movement: Absent    Results for orders placed or performed in visit on 04/10/18 (from the past 24 hour(s))  POC Urinalysis Dipstick OB   Collection  Time: 04/10/18  2:56 PM  Result Value Ref Range   Color, UA     Clarity, UA     Glucose, UA Negative Negative   Bilirubin, UA     Ketones, UA neg    Spec Grav, UA     Blood, UA neg    pH, UA     POC,PROTEIN,UA Negative Negative, Trace   Urobilinogen, UA     Nitrite, UA neg    Leukocytes, UA Negative Negative   Appearance     Odor      Assessment & Plan:  1) Low-risk pregnancy G1P0 at [redacted]w[redacted]d with an Estimated Date of Delivery: 08/25/18   2) ,    Labs/procedures/US today: none  Plan:  Continue routine obstetrical care    Follow-up: Return for asap for anatomy scan only; 4 weeks forLROB.  Orders Placed This Encounter  Procedures  . US OB Comp + 14 Wk  . POC Urinalysis Dipstick OB   Jacklyn Shell CNM 04/10/2018 3:04 PM

## 2018-04-16 ENCOUNTER — Ambulatory Visit (INDEPENDENT_AMBULATORY_CARE_PROVIDER_SITE_OTHER): Payer: PRIVATE HEALTH INSURANCE

## 2018-04-16 DIAGNOSIS — Z363 Encounter for antenatal screening for malformations: Secondary | ICD-10-CM

## 2018-04-16 NOTE — Progress Notes (Signed)
US 21+2 wks,cephalic,anterior placenta gr 0,normal ovaries bilat,SVP of fluid 4.3 cm,FHR 142 bpm,bilat renal pelvic dilatation,LK 5 mm,RK 3.8 mm,cx 3.2 cm,EFW 413 g 44%, anatomy complete

## 2018-05-08 ENCOUNTER — Ambulatory Visit (INDEPENDENT_AMBULATORY_CARE_PROVIDER_SITE_OTHER): Payer: PRIVATE HEALTH INSURANCE | Admitting: Advanced Practice Midwife

## 2018-05-08 VITALS — BP 125/78 | Wt 204.5 lb

## 2018-05-08 DIAGNOSIS — O358XX0 Maternal care for other (suspected) fetal abnormality and damage, not applicable or unspecified: Secondary | ICD-10-CM

## 2018-05-08 DIAGNOSIS — Z1389 Encounter for screening for other disorder: Secondary | ICD-10-CM

## 2018-05-08 DIAGNOSIS — Z331 Pregnant state, incidental: Secondary | ICD-10-CM

## 2018-05-08 DIAGNOSIS — O35EXX0 Maternal care for other (suspected) fetal abnormality and damage, fetal genitourinary anomalies, not applicable or unspecified: Secondary | ICD-10-CM

## 2018-05-08 DIAGNOSIS — Z3A24 24 weeks gestation of pregnancy: Secondary | ICD-10-CM

## 2018-05-08 DIAGNOSIS — Z3402 Encounter for supervision of normal first pregnancy, second trimester: Secondary | ICD-10-CM

## 2018-05-08 LAB — POCT URINALYSIS DIPSTICK OB
Blood, UA: NEGATIVE
Glucose, UA: NEGATIVE
Ketones, UA: NEGATIVE
Leukocytes, UA: NEGATIVE
Nitrite, UA: NEGATIVE
POC,PROTEIN,UA: NEGATIVE

## 2018-05-08 NOTE — Patient Instructions (Signed)

## 2018-05-08 NOTE — Progress Notes (Signed)
   LOW-RISK PREGNANCY VISIT Patient name: Miranda Campbell MRN [id number]  Date of birth: 1995-10-09 Chief Complaint:   Routine Prenatal Visit  History of Present Illness:   Miranda Campbell is a 22 y.o. G1P0 female at 6064w3d with an Estimated Date of Delivery: 08/25/18 being seen today for ongoing management of a low-risk pregnancy.  Today she reports no complaints  . Contractions: Not present. Vag. Bleeding: None.  Movement: Present. denies leaking of fluid. Review of Systems:   Pertinent items are noted in HPI Denies abnormal vaginal discharge w/ itching/odor/irritation, headaches, visual changes, shortness of breath, chest pain, abdominal pain, severe nausea/vomiting, or problems with urination or bowel movements unless otherwise stated above.  Pertinent History Reviewed:  Medical & Surgical Hx:   Past Medical History:  Diagnosis Date  . Medical history non-contributory    Past Surgical History:  Procedure Laterality Date  . NO PAST SURGERIES     Family History  Problem Relation Age of Onset  . Spina bifida Mother     Current Outpatient Medications:  .  Prenatal Vit-DSS-Fe Cbn-FA (PRENATAL AD PO), Take by mouth., Disp: , Rfl:  Social History: Reviewed -  reports that she has never smoked. She has never used smokeless tobacco.  Physical Assessment:   Vitals:   05/08/18 1455  BP: 125/78  Weight: 204 lb 8 oz (92.8 kg)  Body mass index is 36.23 kg/m.        Physical Examination:   General appearance: Well appearing, and in no distress  Mental status: Alert, oriented to person, place, and time  Skin: Warm & dry  Cardiovascular: Normal heart rate noted  Respiratory: Normal respiratory effort, no distress  Abdomen: Soft, gravid, nontender  Pelvic: Cervical exam deferred         Extremities: Edema: None  Fetal Status: Fetal Heart Rate (bpm): 150 Fundal Height: 25 cm Movement: Present    Results for orders placed or performed in visit on 05/08/18 (from the past 24 hour(s))    POC Urinalysis Dipstick OB   Collection Time: 05/08/18  2:58 PM  Result Value Ref Range   Color, UA     Clarity, UA     Glucose, UA Negative Negative   Bilirubin, UA     Ketones, UA neg    Spec Grav, UA     Blood, UA neg    pH, UA     POC,PROTEIN,UA Negative Negative, Trace, Small (1+), Moderate (2+), Large (3+), 4+   Urobilinogen, UA     Nitrite, UA neg    Leukocytes, UA Negative Negative   Appearance     Odor      Assessment & Plan:  1) Low-risk pregnancy G1P0 at 6164w3d with an Estimated Date of Delivery: 08/25/18   2) , mild LK pyelectasis, recheck next visit   Labs/procedures/US today:   Plan:  Continue routine obstetrical care    Follow-up: Return in about 3 weeks (around 05/29/2018) for PN2/LROB, US:OB F/U: LK.  Orders Placed This Encounter  Procedures  . US OB Follow Up  . POC Urinalysis Dipstick OB   Jacklyn ShellFrances Cresenzo-Dishmon CNM 05/08/2018 3:12 PM

## 2018-06-03 ENCOUNTER — Ambulatory Visit (INDEPENDENT_AMBULATORY_CARE_PROVIDER_SITE_OTHER): Payer: PRIVATE HEALTH INSURANCE

## 2018-06-03 ENCOUNTER — Ambulatory Visit (INDEPENDENT_AMBULATORY_CARE_PROVIDER_SITE_OTHER): Payer: PRIVATE HEALTH INSURANCE | Admitting: Obstetrics & Gynecology

## 2018-06-03 ENCOUNTER — Other Ambulatory Visit: Payer: PRIVATE HEALTH INSURANCE

## 2018-06-03 VITALS — BP 118/72 | HR 84 | Wt 206.0 lb

## 2018-06-03 DIAGNOSIS — Z3A28 28 weeks gestation of pregnancy: Secondary | ICD-10-CM

## 2018-06-03 DIAGNOSIS — Z131 Encounter for screening for diabetes mellitus: Secondary | ICD-10-CM

## 2018-06-03 DIAGNOSIS — Z3402 Encounter for supervision of normal first pregnancy, second trimester: Secondary | ICD-10-CM

## 2018-06-03 DIAGNOSIS — Z3403 Encounter for supervision of normal first pregnancy, third trimester: Secondary | ICD-10-CM

## 2018-06-03 DIAGNOSIS — Z1389 Encounter for screening for other disorder: Secondary | ICD-10-CM

## 2018-06-03 DIAGNOSIS — O358XX0 Maternal care for other (suspected) fetal abnormality and damage, not applicable or unspecified: Secondary | ICD-10-CM | POA: Diagnosis not present

## 2018-06-03 DIAGNOSIS — O35EXX0 Maternal care for other (suspected) fetal abnormality and damage, fetal genitourinary anomalies, not applicable or unspecified: Secondary | ICD-10-CM

## 2018-06-03 NOTE — Progress Notes (Signed)
   LOW-RISK PREGNANCY VISIT Patient name: Miranda Campbell MRN [id number]  Date of birth: [birth year] Chief Complaint:   Routine Prenatal Visit (US today PN2)  History of Present Illness:   Miranda Campbell is a 22 y.o. G1P0 female at 444w1d with an Estimated Date of Delivery: 08/25/18 being seen today for ongoing management of a low-risk pregnancy.  Today she reports no complaints. Contractions: Not present. Vag. Bleeding: None.  Movement: Present. denies leaking of fluid. Review of Systems:   Pertinent items are noted in HPI Denies abnormal vaginal discharge w/ itching/odor/irritation, headaches, visual changes, shortness of breath, chest pain, abdominal pain, severe nausea/vomiting, or problems with urination or bowel movements unless otherwise stated above. Pertinent History Reviewed:  Reviewed past medical,surgical, social, obstetrical and family history.  Reviewed problem list, medications and allergies. Physical Assessment:   Vitals:   06/03/18 1213  BP: 118/72  Pulse: 84  Weight: 206 lb (93.4 kg)  Body mass index is 36.49 kg/m.        Physical Examination:   General appearance: Well appearing, and in no distress  Mental status: Alert, oriented to person, place, and time  Skin: Warm & dry  Cardiovascular: Normal heart rate noted  Respiratory: Normal respiratory effort, no distress  Abdomen: Soft, gravid, nontender  Pelvic: Cervical exam deferred         Extremities: Edema: None  Fetal Status: Fetal Heart Rate (bpm): 146   Movement: Present    No results found for this or any previous visit (from the past 24 hour(s)).  Assessment & Plan:  1) Low-risk pregnancy G1P0 at 3144w1d with an Estimated Date of Delivery: 08/25/18   2) Resolved RPD   Meds: No orders of the defined types were placed in this encounter.  Labs/procedures today: sonogram  Plan:  Continue routine obstetrical care   Reviewed: Preterm labor symptoms and general obstetric precautions including but not  limited to vaginal bleeding, contractions, leaking of fluid and fetal movement were reviewed in detail with the patient.  All questions were answered  Follow-up: Return in about 3 weeks (around 06/24/2018) for LROB.  Orders Placed This Encounter  Procedures  . POC Urinalysis Dipstick OB   Lazaro ArmsLuther H Eure  06/03/2018 12:27 PM

## 2018-06-03 NOTE — Progress Notes (Signed)
US 28+1 wks,cephalic,cx 3.4 cm,EFW 1218 g 95%,MWU46%,fhr 146 bpm,afi 14 cm,anterior placenta gr 1,right renal pelvis 3.4 mm,left renal pelvis 4.5 mm (wnl)

## 2018-06-04 LAB — RPR: RPR Ser Ql: NONREACTIVE

## 2018-06-04 LAB — CBC
Hematocrit: 36.1 % (ref 34.0–46.6)
Hemoglobin: 12.5 g/dL (ref 11.1–15.9)
MCH: 31.3 pg (ref 26.6–33.0)
MCHC: 34.6 g/dL (ref 31.5–35.7)
MCV: 91 fL (ref 79–97)
PLATELETS: 288 10*3/uL (ref 150–450)
RBC: 3.99 x10E6/uL (ref 3.77–5.28)
RDW: 12.6 % (ref 12.3–15.4)
WBC: 10.7 10*3/uL (ref 3.4–10.8)

## 2018-06-04 LAB — GLUCOSE TOLERANCE, 2 HOURS W/ 1HR
GLUCOSE, FASTING: 80 mg/dL (ref 65–91)
Glucose, 1 hour: 104 mg/dL (ref 65–179)
Glucose, 2 hour: 137 mg/dL (ref 65–152)

## 2018-06-04 LAB — ANTIBODY SCREEN: Antibody Screen: NEGATIVE

## 2018-06-04 LAB — HIV ANTIBODY (ROUTINE TESTING W REFLEX): HIV SCREEN 4TH GENERATION: NONREACTIVE

## 2018-06-05 NOTE — L&D Delivery Note (Signed)
Miranda Campbell is a 23 y.o. female G1P0 with IUP at [redacted]w[redacted]d admitted for postdates IOL.  She progressed with cytotec, foley bulb, pitocin augmentation to complete and pushed 1.5 hours to deliver.  Cord clamping delayed by several minutes then clamped by CNM and cut by FOB.    Delivery Note At 4:09 AM a viable female was delivered via Vaginal, Spontaneous (Presentation: ROA).  APGAR: 8, 8; weight 8 lb 1.6 oz (3674 g).   Placenta status: spontaneous, intact.  Cord: 3 vessels with the following complications: tight nuchal x2, unable to reduce, delivered through with somersault maneuver and easily reduced after delivery.  Anesthesia:  epidural Episiotomy: None Lacerations: 1st degree; Bilateral Periurethral Suture Repair: 3.0 vicryl and 4.0 monocryl Est. Blood Loss (mL): 253  Mom to postpartum.  Baby to Couplet care / Skin to Skin.  Rolm Bookbinder CNM 09/02/2018, 4:30 AM

## 2018-06-24 ENCOUNTER — Encounter: Payer: Self-pay | Admitting: Women's Health

## 2018-06-24 ENCOUNTER — Ambulatory Visit (INDEPENDENT_AMBULATORY_CARE_PROVIDER_SITE_OTHER): Payer: PRIVATE HEALTH INSURANCE | Admitting: Women's Health

## 2018-06-24 VITALS — BP 129/73 | HR 95 | Wt 212.5 lb

## 2018-06-24 DIAGNOSIS — Z1389 Encounter for screening for other disorder: Secondary | ICD-10-CM

## 2018-06-24 DIAGNOSIS — Z331 Pregnant state, incidental: Secondary | ICD-10-CM

## 2018-06-24 DIAGNOSIS — Z23 Encounter for immunization: Secondary | ICD-10-CM

## 2018-06-24 DIAGNOSIS — Z3A31 31 weeks gestation of pregnancy: Secondary | ICD-10-CM

## 2018-06-24 DIAGNOSIS — Z3403 Encounter for supervision of normal first pregnancy, third trimester: Secondary | ICD-10-CM

## 2018-06-24 LAB — POCT URINALYSIS DIPSTICK OB
Blood, UA: NEGATIVE
GLUCOSE, UA: NEGATIVE
KETONES UA: NEGATIVE
Nitrite, UA: NEGATIVE
POC,PROTEIN,UA: NEGATIVE

## 2018-06-24 NOTE — Progress Notes (Signed)
   LOW-RISK PREGNANCY VISIT Patient name: Miranda Campbell MRN [id number]  Date of birth: 03/29/1996 Chief Complaint:   Routine Prenatal Visit  History of Present Illness:   Miranda Campbell is a 23 y.o. G1P0 female at [redacted]w[redacted]d with an Estimated Date of Delivery: 08/25/18 being seen today for ongoing management of a low-risk pregnancy.  Today she reports no complaints. Contractions: Not present. Vag. Bleeding: None.  Movement: Present. denies leaking of fluid. Review of Systems:   Pertinent items are noted in HPI Denies abnormal vaginal discharge w/ itching/odor/irritation, headaches, visual changes, shortness of breath, chest pain, abdominal pain, severe nausea/vomiting, or problems with urination or bowel movements unless otherwise stated above. Pertinent History Reviewed:  Reviewed past medical,surgical, social, obstetrical and family history.  Reviewed problem list, medications and allergies. Physical Assessment:   Vitals:   06/24/18 1339  BP: 129/73  Pulse: 95  Weight: 212 lb 8 oz (96.4 kg)  Body mass index is 37.64 kg/m.        Physical Examination:   General appearance: Well appearing, and in no distress  Mental status: Alert, oriented to person, place, and time  Skin: Warm & dry  Cardiovascular: Normal heart rate noted  Respiratory: Normal respiratory effort, no distress  Abdomen: Soft, gravid, nontender  Pelvic: Cervical exam deferred         Extremities: Edema: None  Fetal Status: Fetal Heart Rate (bpm): 160 Fundal Height: 31 cm Movement: Present    Results for orders placed or performed in visit on 06/24/18 (from the past 24 hour(s))  POC Urinalysis Dipstick OB   Collection Time: 06/24/18  1:40 PM  Result Value Ref Range   Color, UA     Clarity, UA     Glucose, UA Negative Negative   Bilirubin, UA     Ketones, UA neg    Spec Grav, UA     Blood, UA neg    pH, UA     POC,PROTEIN,UA Negative Negative, Trace, Small (1+), Moderate (2+), Large (3+), 4+   Urobilinogen,  UA     Nitrite, UA neg    Leukocytes, UA Trace (A) Negative   Appearance     Odor      Assessment & Plan:  1) Low-risk pregnancy G1P0 at [redacted]w[redacted]d with an Estimated Date of Delivery: 08/25/18   Meds: No orders of the defined types were placed in this encounter.  Labs/procedures today: tdap & flu shot  Plan:  Continue routine obstetrical care   Reviewed: Preterm labor symptoms and general obstetric precautions including but not limited to vaginal bleeding, contractions, leaking of fluid and fetal movement were reviewed in detail with the patient.  All questions were answered  Follow-up: Return in about 2 weeks (around 07/08/2018) for LROB.  Orders Placed This Encounter  Procedures  . Tdap vaccine greater than or equal to 7yo IM  . Flu Vaccine QUAD 36+ mos IM (Fluarix, Quad PF)  . POC Urinalysis Dipstick OB   Cheral Marker CNM, [facility] 06/24/2018 2:08 PM

## 2018-06-24 NOTE — Patient Instructions (Signed)
Miranda Campbell, I greatly value your feedback.  If you receive a survey following your visit with [place] today, we appreciate you taking the time to fill it out.  Thanks, [name], CNM, WHNP-BC   Call the office [phone]) or go to [facility] if:  You begin to have strong, frequent contractions  Your water breaks.  Sometimes it is a big gush of fluid, sometimes it is just a trickle that keeps getting your panties wet or running down your legs  You have vaginal bleeding.  It is normal to have a small amount of spotting if your cervix was checked.   You don't feel your baby moving like normal.  If you don't, get you something to eat and drink and lay down and focus on feeling your baby move.  You should feel at least 10 movements in 2 hours.  If you don't, you should call the office or go to [facility].    Tdap Vaccine  It is recommended that you get the Tdap vaccine during the third trimester of EACH pregnancy to help protect your baby from getting pertussis (whooping cough)  27-36 weeks is the BEST time to do this so that you can pass the protection on to your baby. During pregnancy is better than after pregnancy, but if you are unable to get it during pregnancy it will be offered at the hospital.   You can get this vaccine with [place], at the health department, your family doctor, or some local pharmacies  Everyone who will be around your baby should also be up-to-date on their vaccines before the baby comes. Adults (who are not pregnant) only need 1 dose of Tdap during adulthood.   Third Trimester of Pregnancy The third trimester is from week 29 through week 42, months 7 through 9. The third trimester is a time when the fetus is growing rapidly. At the end of the ninth month, the fetus is about 20 inches in length and weighs 6-10 pounds.  BODY CHANGES Your body goes through many changes during pregnancy. The changes vary from woman to woman.   Your weight will continue to  increase. You can expect to gain 25-35 pounds (11-16 kg) by the end of the pregnancy.  You may begin to get stretch marks on your hips, abdomen, and breasts.  You may urinate more often because the fetus is moving lower into your pelvis and pressing on your bladder.  You may develop or continue to have heartburn as a result of your pregnancy.  You may develop constipation because certain hormones are causing the muscles that push waste through your intestines to slow down.  You may develop hemorrhoids or swollen, bulging veins (varicose veins).  You may have pelvic pain because of the weight gain and pregnancy hormones relaxing your joints between the bones in your pelvis. Backaches may result from overexertion of the muscles supporting your posture.  You may have changes in your hair. These can include thickening of your hair, rapid growth, and changes in texture. Some women also have hair loss during or after pregnancy, or hair that feels dry or thin. Your hair will most likely return to normal after your baby is born.  Your breasts will continue to grow and be tender. A yellow discharge may leak from your breasts called colostrum.  Your belly button may stick out.  You may feel short of breath because of your expanding uterus.  You may notice the fetus "dropping," or moving lower in  your abdomen.  You may have a bloody mucus discharge. This usually occurs a few days to a week before labor begins.  Your cervix becomes thin and soft (effaced) near your due date. WHAT TO EXPECT AT YOUR PRENATAL EXAMS  You will have prenatal exams every 2 weeks until week 36. Then, you will have weekly prenatal exams. During a routine prenatal visit:  You will be weighed to make sure you and the fetus are growing normally.  Your blood pressure is taken.  Your abdomen will be measured to track your baby's growth.  The fetal heartbeat will be listened to.  Any test results from the previous visit  will be discussed.  You may have a cervical check near your due date to see if you have effaced. At around 36 weeks, your caregiver will check your cervix. At the same time, your caregiver will also perform a test on the secretions of the vaginal tissue. This test is to determine if a type of bacteria, Group B streptococcus, is present. Your caregiver will explain this further. Your caregiver may ask you:  What your birth plan is.  How you are feeling.  If you are feeling the baby move.  If you have had any abnormal symptoms, such as leaking fluid, bleeding, severe headaches, or abdominal cramping.  If you have any questions. Other tests or screenings that may be performed during your third trimester include:  Blood tests that check for low iron levels (anemia).  Fetal testing to check the health, activity level, and growth of the fetus. Testing is done if you have certain medical conditions or if there are problems during the pregnancy. FALSE LABOR You may feel small, irregular contractions that eventually go away. These are called Braxton Hicks contractions, or false labor. Contractions may last for hours, days, or even weeks before true labor sets in. If contractions come at regular intervals, intensify, or become painful, it is best to be seen by your caregiver.  SIGNS OF LABOR   Menstrual-like cramps.  Contractions that are 5 minutes apart or less.  Contractions that start on the top of the uterus and spread down to the lower abdomen and back.  A sense of increased pelvic pressure or back pain.  A watery or bloody mucus discharge that comes from the vagina. If you have any of these signs before the 37th week of pregnancy, call your caregiver right away. You need to go to the hospital to get checked immediately. HOME CARE INSTRUCTIONS   Avoid all smoking, herbs, alcohol, and unprescribed drugs. These chemicals affect the formation and growth of the baby.  Follow your  caregiver's instructions regarding medicine use. There are medicines that are either safe or unsafe to take during pregnancy.  Exercise only as directed by your caregiver. Experiencing uterine cramps is a good sign to stop exercising.  Continue to eat regular, healthy meals.  Wear a good support bra for breast tenderness.  Do not use hot tubs, steam rooms, or saunas.  Wear your seat belt at all times when driving.  Avoid raw meat, uncooked cheese, cat litter boxes, and soil used by cats. These carry germs that can cause birth defects in the baby.  Take your prenatal vitamins.  Try taking a stool softener (if your caregiver approves) if you develop constipation. Eat more high-fiber foods, such as fresh vegetables or fruit and whole grains. Drink plenty of fluids to keep your urine clear or pale yellow.  Take warm sitz baths to  soothe any pain or discomfort caused by hemorrhoids. Use hemorrhoid cream if your caregiver approves.  If you develop varicose veins, wear support hose. Elevate your feet for 15 minutes, 3-4 times a day. Limit salt in your diet.  Avoid heavy lifting, wear low heal shoes, and practice good posture.  Rest a lot with your legs elevated if you have leg cramps or low back pain.  Visit your dentist if you have not gone during your pregnancy. Use a soft toothbrush to brush your teeth and be gentle when you floss.  A sexual relationship may be continued unless your caregiver directs you otherwise.  Do not travel far distances unless it is absolutely necessary and only with the approval of your caregiver.  Take prenatal classes to understand, practice, and ask questions about the labor and delivery.  Make a trial run to the hospital.  Pack your hospital bag.  Prepare the baby's nursery.  Continue to go to all your prenatal visits as directed by your caregiver. SEEK MEDICAL CARE IF:  You are unsure if you are in labor or if your water has broken.  You have  dizziness.  You have mild pelvic cramps, pelvic pressure, or nagging pain in your abdominal area.  You have persistent nausea, vomiting, or diarrhea.  You have a bad smelling vaginal discharge.  You have pain with urination. SEEK IMMEDIATE MEDICAL CARE IF:   You have a fever.  You are leaking fluid from your vagina.  You have spotting or bleeding from your vagina.  You have severe abdominal cramping or pain.  You have rapid weight loss or gain.  You have shortness of breath with chest pain.  You notice sudden or extreme swelling of your face, hands, ankles, feet, or legs.  You have not felt your baby move in over an hour.  You have severe headaches that do not go away with medicine.  You have vision changes. Document Released: 05/16/2001 Document Revised: 05/27/2013 Document Reviewed: 07/23/2012 [facility] Patient Information 2015 [place], [place]. This information is not intended to replace advice given to you by your health care provider. Make sure you discuss any questions you have with your health care provider.

## 2018-07-08 ENCOUNTER — Encounter: Payer: Self-pay | Admitting: Obstetrics & Gynecology

## 2018-07-08 ENCOUNTER — Ambulatory Visit (INDEPENDENT_AMBULATORY_CARE_PROVIDER_SITE_OTHER): Payer: PRIVATE HEALTH INSURANCE | Admitting: Obstetrics & Gynecology

## 2018-07-08 VITALS — BP 115/71 | HR 92 | Wt 212.5 lb

## 2018-07-08 DIAGNOSIS — Z3403 Encounter for supervision of normal first pregnancy, third trimester: Secondary | ICD-10-CM

## 2018-07-08 DIAGNOSIS — Z331 Pregnant state, incidental: Secondary | ICD-10-CM

## 2018-07-08 DIAGNOSIS — Z3A33 33 weeks gestation of pregnancy: Secondary | ICD-10-CM

## 2018-07-08 DIAGNOSIS — Z1389 Encounter for screening for other disorder: Secondary | ICD-10-CM

## 2018-07-08 LAB — POCT URINALYSIS DIPSTICK OB
Blood, UA: NEGATIVE
Glucose, UA: NEGATIVE
Ketones, UA: NEGATIVE
Leukocytes, UA: NEGATIVE
Nitrite, UA: NEGATIVE
POC,PROTEIN,UA: NEGATIVE

## 2018-07-08 NOTE — Progress Notes (Signed)
   LOW-RISK PREGNANCY VISIT Patient name: Miranda Campbell MRN [id number]  Date of birth: [birth year] Chief Complaint:   Routine Prenatal Visit  History of Present Illness:   Miranda Campbell is a 23 y.o. G1P0 female at [redacted]w[redacted]d with an Estimated Date of Delivery: 08/25/18 being seen today for ongoing management of a low-risk pregnancy.  Today she reports no complaints. Contractions: Not present. Vag. Bleeding: None.  Movement: Present. denies leaking of fluid. Review of Systems:   Pertinent items are noted in HPI Denies abnormal vaginal discharge w/ itching/odor/irritation, headaches, visual changes, shortness of breath, chest pain, abdominal pain, severe nausea/vomiting, or problems with urination or bowel movements unless otherwise stated above. Pertinent History Reviewed:  Reviewed past medical,surgical, social, obstetrical and family history.  Reviewed problem list, medications and allergies. Physical Assessment:   Vitals:   07/08/18 1416  BP: 115/71  Pulse: 92  Weight: 212 lb 8 oz (96.4 kg)  Body mass index is 37.64 kg/m.        Physical Examination:   General appearance: Well appearing, and in no distress  Mental status: Alert, oriented to person, place, and time  Skin: Warm & dry  Cardiovascular: Normal heart rate noted  Respiratory: Normal respiratory effort, no distress  Abdomen: Soft, gravid, nontender  Pelvic: Cervical exam deferred         Extremities: Edema: None  Fetal Status:     Movement: Present    Results for orders placed or performed in visit on 07/08/18 (from the past 24 hour(s))  POC Urinalysis Dipstick OB   Collection Time: 07/08/18  2:17 PM  Result Value Ref Range   Color, UA     Clarity, UA     Glucose, UA Negative Negative   Bilirubin, UA     Ketones, UA neg    Spec Grav, UA     Blood, UA neg    pH, UA     POC,PROTEIN,UA Negative Negative, Trace, Small (1+), Moderate (2+), Large (3+), 4+   Urobilinogen, UA     Nitrite, UA neg    Leukocytes, UA  Negative Negative   Appearance     Odor      Assessment & Plan:  1) Low-risk pregnancy G1P0 at [redacted]w[redacted]d with an Estimated Date of Delivery: 08/25/18      Meds: No orders of the defined types were placed in this encounter.  Labs/procedures today:   Plan:  Continue routine obstetrical care   Reviewed: Preterm labor symptoms and general obstetric precautions including but not limited to vaginal bleeding, contractions, leaking of fluid and fetal movement were reviewed in detail with the patient.  All questions were answered  Follow-up: Return in about 2 weeks (around 07/22/2018) for LROB.  Orders Placed This Encounter  Procedures  . POC Urinalysis Dipstick OB   Lazaro Arms  07/08/2018 2:44 PM

## 2018-07-22 ENCOUNTER — Ambulatory Visit (INDEPENDENT_AMBULATORY_CARE_PROVIDER_SITE_OTHER): Payer: PRIVATE HEALTH INSURANCE | Admitting: Women's Health

## 2018-07-22 ENCOUNTER — Encounter: Payer: Self-pay | Admitting: Women's Health

## 2018-07-22 VITALS — BP 126/80 | HR 90 | Wt 215.6 lb

## 2018-07-22 DIAGNOSIS — Z8279 Family history of other congenital malformations, deformations and chromosomal abnormalities: Secondary | ICD-10-CM

## 2018-07-22 DIAGNOSIS — Z1389 Encounter for screening for other disorder: Secondary | ICD-10-CM

## 2018-07-22 DIAGNOSIS — Z331 Pregnant state, incidental: Secondary | ICD-10-CM

## 2018-07-22 DIAGNOSIS — Z3403 Encounter for supervision of normal first pregnancy, third trimester: Secondary | ICD-10-CM

## 2018-07-22 DIAGNOSIS — Z3A35 35 weeks gestation of pregnancy: Secondary | ICD-10-CM

## 2018-07-22 LAB — POCT URINALYSIS DIPSTICK OB
Blood, UA: NEGATIVE
GLUCOSE, UA: NEGATIVE
Ketones, UA: NEGATIVE
NITRITE UA: NEGATIVE
POC,PROTEIN,UA: NEGATIVE

## 2018-07-22 NOTE — Progress Notes (Signed)
   LOW-RISK PREGNANCY VISIT Patient name: Miranda Campbell MRN [id number]  Date of birth: August 29, 1995 Chief Complaint:   Routine Prenatal Visit  History of Present Illness:   Miranda Campbell is a 23 y.o. G1P0 female at [redacted]w[redacted]d with an Estimated Date of Delivery: 08/25/18 being seen today for ongoing management of a low-risk pregnancy.  Today she reports no complaints. Contractions: Not present. Vag. Bleeding: None.  Movement: Present. denies leaking of fluid. Review of Systems:   Pertinent items are noted in HPI Denies abnormal vaginal discharge w/ itching/odor/irritation, headaches, visual changes, shortness of breath, chest pain, abdominal pain, severe nausea/vomiting, or problems with urination or bowel movements unless otherwise stated above. Pertinent History Reviewed:  Reviewed past medical,surgical, social, obstetrical and family history.  Reviewed problem list, medications and allergies. Physical Assessment:   Vitals:   07/22/18 1439  BP: 126/80  Pulse: 90  Weight: 215 lb 9.6 oz (97.8 kg)  Body mass index is 38.19 kg/m.        Physical Examination:   General appearance: Well appearing, and in no distress  Mental status: Alert, oriented to person, place, and time  Skin: Warm & dry  Cardiovascular: Normal heart rate noted  Respiratory: Normal respiratory effort, no distress  Abdomen: Soft, gravid, nontender  Pelvic: Cervical exam deferred         Extremities: Edema: None  Fetal Status: Fetal Heart Rate (bpm): 150 Fundal Height: 35 cm Movement: Present    Results for orders placed or performed in visit on 07/22/18 (from the past 24 hour(s))  POC Urinalysis Dipstick OB   Collection Time: 07/22/18  2:40 PM  Result Value Ref Range   Color, UA     Clarity, UA     Glucose, UA Negative Negative   Bilirubin, UA     Ketones, UA neg    Spec Grav, UA     Blood, UA neg    pH, UA     POC,PROTEIN,UA Negative Negative, Trace, Small (1+), Moderate (2+), Large (3+), 4+   Urobilinogen,  UA     Nitrite, UA neg    Leukocytes, UA Large (3+) (A) Negative   Appearance     Odor      Assessment & Plan:  1) Low-risk pregnancy G1P0 at [redacted]w[redacted]d with an Estimated Date of Delivery: 08/25/18    Meds: No orders of the defined types were placed in this encounter.  Labs/procedures today: none  Plan:  Continue routine obstetrical care   Reviewed: Preterm labor symptoms and general obstetric precautions including but not limited to vaginal bleeding, contractions, leaking of fluid and fetal movement were reviewed in detail with the patient.  All questions were answered  Follow-up: Return in about 1 week (around 07/29/2018) for LROB.  Orders Placed This Encounter  Procedures  . POC Urinalysis Dipstick OB   Cheral Marker CNM, [facility] 07/22/2018 3:19 PM

## 2018-07-22 NOTE — Patient Instructions (Addendum)
Miranda Campbell, I greatly value your feedback.  If you receive a survey following your visit with [place] today, we appreciate you taking the time to fill it out.  Thanks, [name], CNM, [facility]  [facility] IS MOVING!!! to [facility] ([address] [place], [place] [address]) on Sunday July 28, 2018 at 5:00am It will be called the [facility], and it is located off of [address]. DO NOT GO TO [address] (the current [facility]) on February 23rd or after, no one will be there!     Call the office [phone]) or go to [facility] if:  You begin to have strong, frequent contractions  Your water breaks.  Sometimes it is a big gush of fluid, sometimes it is just a trickle that keeps getting your panties wet or running down your legs  You have vaginal bleeding.  It is normal to have a small amount of spotting if your cervix was checked.   You don't feel your baby moving like normal.  If you don't, get you something to eat and drink and lay down and focus on feeling your baby move.  You should feel at least 10 movements in 2 hours.  If you don't, you should call the office or go to [facility].     Preterm Labor and Birth Information  The normal length of a pregnancy is 39-41 weeks. Preterm labor is when labor starts before 37 completed weeks of pregnancy. What are the risk factors for preterm labor? Preterm labor is more likely to occur in women who:  Have certain infections during pregnancy such as a bladder infection, sexually transmitted infection, or infection inside the uterus (chorioamnionitis).  Have a shorter-than-normal cervix.  Have gone into preterm labor before.  Have had surgery on their cervix.  Are younger than age 21 or older than age 90.  Are African American.  Are pregnant with twins or multiple babies (multiple gestation).  Take street drugs or smoke while pregnant.  Do not gain enough weight while  pregnant.  Became pregnant shortly after having been pregnant. What are the symptoms of preterm labor? Symptoms of preterm labor include:  Cramps similar to those that can happen during a menstrual period. The cramps may happen with diarrhea.  Pain in the abdomen or lower back.  Regular uterine contractions that may feel like tightening of the abdomen.  A feeling of increased pressure in the pelvis.  Increased watery or bloody mucus discharge from the vagina.  Water breaking (ruptured amniotic sac). Why is it important to recognize signs of preterm labor? It is important to recognize signs of preterm labor because babies who are born prematurely may not be fully developed. This can put them at an increased risk for:  Long-term (chronic) heart and lung problems.  Difficulty immediately after birth with regulating body systems, including blood sugar, body temperature, heart rate, and breathing rate.  Bleeding in the brain.  Cerebral palsy.  Learning difficulties.  Death. These risks are highest for babies who are born before 34 weeks of pregnancy. How is preterm labor treated? Treatment depends on the length of your pregnancy, your condition, and the health of your baby. It may involve:  Having a stitch (suture) placed in your cervix to prevent your cervix from opening too early (cerclage).  Taking or being given medicines, such as: ? Hormone medicines. These may be given early in pregnancy to help support the pregnancy. ? Medicine to  stop contractions. ? Medicines to help mature the baby's lungs. These may be prescribed if the risk of delivery is high. ? Medicines to prevent your baby from developing cerebral palsy. If the labor happens before 34 weeks of pregnancy, you may need to stay in the hospital. What should I do if I think I am in preterm labor? If you think that you are going into preterm labor, call your health care provider right away. How can I prevent preterm  labor in future pregnancies? To increase your chance of having a full-term pregnancy:  Do not use any tobacco products, such as cigarettes, chewing tobacco, and e-cigarettes. If you need help quitting, ask your health care provider.  Do not use street drugs or medicines that have not been prescribed to you during your pregnancy.  Talk with your health care provider before taking any herbal supplements, even if you have been taking them regularly.  Make sure you gain a healthy amount of weight during your pregnancy.  Watch for infection. If you think that you might have an infection, get it checked right away.  Make sure to tell your health care provider if you have gone into preterm labor before. This information is not intended to replace advice given to you by your health care provider. Make sure you discuss any questions you have with your health care provider. Document Released: 08/12/2003 Document Revised: 11/02/2015 Document Reviewed: 10/13/2015 Elsevier Interactive Patient Education  2019 [employer].

## 2018-07-30 ENCOUNTER — Ambulatory Visit (INDEPENDENT_AMBULATORY_CARE_PROVIDER_SITE_OTHER): Payer: PRIVATE HEALTH INSURANCE | Admitting: Women's Health

## 2018-07-30 ENCOUNTER — Encounter: Payer: Self-pay | Admitting: Women's Health

## 2018-07-30 VITALS — BP 125/78 | HR 92 | Wt 218.0 lb

## 2018-07-30 DIAGNOSIS — Z3403 Encounter for supervision of normal first pregnancy, third trimester: Secondary | ICD-10-CM

## 2018-07-30 DIAGNOSIS — Z1389 Encounter for screening for other disorder: Secondary | ICD-10-CM

## 2018-07-30 DIAGNOSIS — Z331 Pregnant state, incidental: Secondary | ICD-10-CM

## 2018-07-30 DIAGNOSIS — Z3A36 36 weeks gestation of pregnancy: Secondary | ICD-10-CM

## 2018-07-30 LAB — POCT URINALYSIS DIPSTICK OB
Blood, UA: NEGATIVE
Glucose, UA: NEGATIVE
KETONES UA: NEGATIVE
Leukocytes, UA: NEGATIVE
NITRITE UA: NEGATIVE
POC,PROTEIN,UA: NEGATIVE

## 2018-07-30 NOTE — Patient Instructions (Signed)
Miranda Campbell, I greatly value your feedback.  If you receive a survey following your visit with [place] today, we appreciate you taking the time to fill it out.  Thanks, [name], CNM, WHNP-BC   Call the office [phone]) or go to [facility] if:  You begin to have strong, frequent contractions  Your water breaks.  Sometimes it is a big gush of fluid, sometimes it is just a trickle that keeps getting your panties wet or running down your legs  You have vaginal bleeding.  It is normal to have a small amount of spotting if your cervix was checked.   You don't feel your baby moving like normal.  If you don't, get you something to eat and drink and lay down and focus on feeling your baby move.  You should feel at least 10 movements in 2 hours.  If you don't, you should call the office or go to [facility].     [facility] Contractions Contractions of the uterus can occur throughout pregnancy, but they are not always a sign that you are in labor. You may have practice contractions called Braxton Hicks contractions. These false labor contractions are sometimes confused with true labor. What are Deberah Pelton contractions? Braxton Hicks contractions are tightening movements that occur in the muscles of the uterus before labor. Unlike true labor contractions, these contractions do not result in opening (dilation) and thinning of the cervix. Toward the end of pregnancy (32-34 weeks), Braxton Hicks contractions can happen more often and may become stronger. These contractions are sometimes difficult to tell apart from true labor because they can be very uncomfortable. You should not feel embarrassed if you go to the hospital with false labor. Sometimes, the only way to tell if you are in true labor is for your health care provider to look for changes in the cervix. The health care provider will do a physical exam and may monitor your contractions. If you are not in true labor, the exam should  show that your cervix is not dilating and your water has not broken. If there are no other health problems associated with your pregnancy, it is completely safe for you to be sent home with false labor. You may continue to have Braxton Hicks contractions until you go into true labor. How to tell the difference between true labor and false labor True labor  Contractions last 30-70 seconds.  Contractions become very regular.  Discomfort is usually felt in the top of the uterus, and it spreads to the lower abdomen and low back.  Contractions do not go away with walking.  Contractions usually become more intense and increase in frequency.  The cervix dilates and gets thinner. False labor  Contractions are usually shorter and not as strong as true labor contractions.  Contractions are usually irregular.  Contractions are often felt in the front of the lower abdomen and in the groin.  Contractions may go away when you walk around or change positions while lying down.  Contractions get weaker and are shorter-lasting as time goes on.  The cervix usually does not dilate or become thin. Follow these instructions at home:   Take over-the-counter and prescription medicines only as told by your health care provider.  Keep up with your usual exercises and follow other instructions from your health care provider.  Eat and drink lightly if you think you are going into labor.  If Braxton Hicks contractions are making you uncomfortable: ? Change your position from  lying down or resting to walking, or change from walking to resting. ? Sit and rest in a tub of warm water. ? Drink enough fluid to keep your urine pale yellow. Dehydration may cause these contractions. ? Do slow and deep breathing several times an hour.  Keep all follow-up prenatal visits as told by your health care provider. This is important. Contact a health care provider if:  You have a fever.  You have continuous pain  in your abdomen. Get help right away if:  Your contractions become stronger, more regular, and closer together.  You have fluid leaking or gushing from your vagina.  You pass blood-tinged mucus (bloody show).  You have bleeding from your vagina.  You have low back pain that you never had before.  You feel your baby's head pushing down and causing pelvic pressure.  Your baby is not moving inside you as much as it used to. Summary  Contractions that occur before labor are called Braxton Hicks contractions, false labor, or practice contractions.  Braxton Hicks contractions are usually shorter, weaker, farther apart, and less regular than true labor contractions. True labor contractions usually become progressively stronger and regular, and they become more frequent.  Manage discomfort from [facility] contractions by changing position, resting in a warm bath, drinking plenty of water, or practicing deep breathing. This information is not intended to replace advice given to you by your health care provider. Make sure you discuss any questions you have with your health care provider. Document Released: 10/05/2016 Document Revised: 03/06/2017 Document Reviewed: 10/05/2016 Elsevier Interactive Patient Education  2019 [employer].

## 2018-07-30 NOTE — Progress Notes (Signed)
   LOW-RISK PREGNANCY VISIT Patient name: Miranda Campbell MRN [id number]  Date of birth: 1996/05/16 Chief Complaint:   Routine Prenatal Visit  History of Present Illness:   Miranda Campbell is a 23 y.o. G1P0 female at [redacted]w[redacted]d with an Estimated Date of Delivery: 08/25/18 being seen today for ongoing management of a low-risk pregnancy.  Today she reports no complaints. Contractions: Irregular. Vag. Bleeding: None.  Movement: Present. denies leaking of fluid. Review of Systems:   Pertinent items are noted in HPI Denies abnormal vaginal discharge w/ itching/odor/irritation, headaches, visual changes, shortness of breath, chest pain, abdominal pain, severe nausea/vomiting, or problems with urination or bowel movements unless otherwise stated above. Pertinent History Reviewed:  Reviewed past medical,surgical, social, obstetrical and family history.  Reviewed problem list, medications and allergies. Physical Assessment:   Vitals:   07/30/18 1534  BP: 125/78  Pulse: 92  Weight: 218 lb (98.9 kg)  Body mass index is 38.62 kg/m.        Physical Examination:   General appearance: Well appearing, and in no distress  Mental status: Alert, oriented to person, place, and time  Skin: Warm & dry  Cardiovascular: Normal heart rate noted  Respiratory: Normal respiratory effort, no distress  Abdomen: Soft, gravid, nontender  Pelvic: Cervical exam performed  Dilation: 1 Effacement (%): Thick Station: -2  Extremities: Edema: Trace  Fetal Status: Fetal Heart Rate (bpm): 155 Fundal Height: 35 cm Movement: Present Presentation: Vertex  Results for orders placed or performed in visit on 07/30/18 (from the past 24 hour(s))  POC Urinalysis Dipstick OB   Collection Time: 07/30/18  3:36 PM  Result Value Ref Range   Color, UA     Clarity, UA     Glucose, UA Negative Negative   Bilirubin, UA     Ketones, UA neg    Spec Grav, UA     Blood, UA neg    pH, UA     POC,PROTEIN,UA Negative Negative, Trace, Small  (1+), Moderate (2+), Large (3+), 4+   Urobilinogen, UA     Nitrite, UA neg    Leukocytes, UA Negative Negative   Appearance     Odor      Assessment & Plan:  1) Low-risk pregnancy G1P0 at [redacted]w[redacted]d with an Estimated Date of Delivery: 08/25/18    Meds: No orders of the defined types were placed in this encounter.  Labs/procedures today: gbs, gc/ct, sve  Plan:  Continue routine obstetrical care   Reviewed: Preterm labor symptoms and general obstetric precautions including but not limited to vaginal bleeding, contractions, leaking of fluid and fetal movement were reviewed in detail with the patient.  All questions were answered  Follow-up: Return in about 1 week (around 08/06/2018) for LROB.  Orders Placed This Encounter  Procedures  . GC/Chlamydia Probe Amp  . Culture, beta strep (group b only)  . POC Urinalysis Dipstick OB   Cheral Marker CNM, [facility] 07/30/2018 3:59 PM

## 2018-08-01 LAB — GC/CHLAMYDIA PROBE AMP
CHLAMYDIA, DNA PROBE: NEGATIVE
NEISSERIA GONORRHOEAE BY PCR: NEGATIVE

## 2018-08-03 LAB — CULTURE, BETA STREP (GROUP B ONLY): Strep Gp B Culture: NEGATIVE

## 2018-08-07 ENCOUNTER — Ambulatory Visit (INDEPENDENT_AMBULATORY_CARE_PROVIDER_SITE_OTHER): Payer: PRIVATE HEALTH INSURANCE | Admitting: Advanced Practice Midwife

## 2018-08-07 ENCOUNTER — Encounter: Payer: Self-pay | Admitting: Advanced Practice Midwife

## 2018-08-07 VITALS — BP 127/80 | HR 85 | Wt 218.0 lb

## 2018-08-07 DIAGNOSIS — Z3403 Encounter for supervision of normal first pregnancy, third trimester: Secondary | ICD-10-CM

## 2018-08-07 DIAGNOSIS — Z331 Pregnant state, incidental: Secondary | ICD-10-CM

## 2018-08-07 DIAGNOSIS — Z3A37 37 weeks gestation of pregnancy: Secondary | ICD-10-CM

## 2018-08-07 DIAGNOSIS — Z1389 Encounter for screening for other disorder: Secondary | ICD-10-CM

## 2018-08-07 LAB — POCT URINALYSIS DIPSTICK OB
Blood, UA: NEGATIVE
GLUCOSE, UA: NEGATIVE
Ketones, UA: NEGATIVE
Leukocytes, UA: NEGATIVE
Nitrite, UA: NEGATIVE
POC,PROTEIN,UA: NEGATIVE

## 2018-08-07 NOTE — Progress Notes (Signed)
  G1P0 [redacted]w[redacted]d Estimated Date of Delivery: 08/25/18  Blood pressure 127/80, pulse 85, weight 218 lb (98.9 kg), last menstrual period 11/11/2017.   BP weight and urine results all reviewed and noted.  Please refer to the obstetrical flow sheet for the fundal height and fetal heart rate documentation:  Patient reports good fetal movement, denies any bleeding and no rupture of membranes symptoms or regular contractions. Patient is without complaints. All questions were answered.   Physical Assessment:   Vitals:   08/07/18 1401  BP: 127/80  Pulse: 85  Weight: 218 lb (98.9 kg)  Body mass index is 38.62 kg/m.        Physical Examination:   General appearance: Well appearing, and in no distress  Mental status: Alert, oriented to person, place, and time  Skin: Warm & dry  Cardiovascular: Normal heart rate noted  Respiratory: Normal respiratory effort, no distress  Abdomen: Soft, gravid, nontender  Pelvic: Cervical exam deferred         Extremities: Edema: None  Fetal Status: Fetal Heart Rate (bpm): 149 Fundal Height: 36 cm Movement: Present    Results for orders placed or performed in visit on 08/07/18 (from the past 24 hour(s))  POC Urinalysis Dipstick OB   Collection Time: 08/07/18  2:00 PM  Result Value Ref Range   Color, UA     Clarity, UA     Glucose, UA Negative Negative   Bilirubin, UA     Ketones, UA neg    Spec Grav, UA     Blood, UA neg    pH, UA     POC,PROTEIN,UA Negative Negative, Trace, Small (1+), Moderate (2+), Large (3+), 4+   Urobilinogen, UA     Nitrite, UA neg    Leukocytes, UA Negative Negative   Appearance     Odor       Orders Placed This Encounter  Procedures  . POC Urinalysis Dipstick OB    Plan:  Continued routine obstetrical care,   Return in about 1 week (around 08/14/2018) for LROB.

## 2018-08-07 NOTE — Patient Instructions (Signed)

## 2018-08-14 ENCOUNTER — Encounter: Payer: Self-pay | Admitting: Obstetrics and Gynecology

## 2018-08-14 ENCOUNTER — Ambulatory Visit (INDEPENDENT_AMBULATORY_CARE_PROVIDER_SITE_OTHER): Payer: PRIVATE HEALTH INSURANCE | Admitting: Obstetrics and Gynecology

## 2018-08-14 ENCOUNTER — Other Ambulatory Visit: Payer: Self-pay

## 2018-08-14 VITALS — BP 116/79 | HR 88 | Wt 219.0 lb

## 2018-08-14 DIAGNOSIS — Z331 Pregnant state, incidental: Secondary | ICD-10-CM

## 2018-08-14 DIAGNOSIS — Z1389 Encounter for screening for other disorder: Secondary | ICD-10-CM

## 2018-08-14 DIAGNOSIS — Z3A38 38 weeks gestation of pregnancy: Secondary | ICD-10-CM

## 2018-08-14 DIAGNOSIS — Z3403 Encounter for supervision of normal first pregnancy, third trimester: Secondary | ICD-10-CM

## 2018-08-14 LAB — POCT URINALYSIS DIPSTICK OB
Blood, UA: NEGATIVE
Glucose, UA: NEGATIVE
Ketones, UA: NEGATIVE
Nitrite, UA: NEGATIVE
POC,PROTEIN,UA: NEGATIVE

## 2018-08-14 NOTE — Progress Notes (Signed)
Patient ID: Miranda Campbell, female   DOB: 06-29-1995, 23 y.o.   MRN: [id number]    LOW-RISK PREGNANCY VISIT Patient name: Miranda Campbell MRN [id number]  Date of birth: July 19, 1995 Chief Complaint:   Routine Prenatal Visit  History of Present Illness:   Miranda Campbell is a 23 y.o. G1P0 female at [redacted]w[redacted]d with an Estimated Date of Delivery: 08/25/18 being seen today for ongoing management of a low-risk pregnancy.  Today she reports no complaints. Contractions: Not present. Vag. Bleeding: None.  Movement: Present. denies leaking of fluid. Review of Systems:   Pertinent items are noted in HPI Denies abnormal vaginal discharge w/ itching/odor/irritation, headaches, visual changes, shortness of breath, chest pain, abdominal pain, severe nausea/vomiting, or problems with urination or bowel movements unless otherwise stated above. Pertinent History Reviewed:  Reviewed past medical,surgical, social, obstetrical and family history.  Reviewed problem list, medications and allergies. Physical Assessment:   Vitals:   08/14/18 1432  BP: 116/79  Pulse: 88  Weight: 219 lb (99.3 kg)  Body mass index is 38.79 kg/m.        Physical Examination:   General appearance: Well appearing, and in no distress  Mental status: Alert, oriented to person, place, and time  Skin: Warm & dry  Cardiovascular: Normal heart rate noted  Respiratory: Normal respiratory effort, no distress  Abdomen: Soft, gravid, nontender  Pelvic: Cervical exam deferred         Extremities: Edema: None  Fetal Status: Fetal Heart Rate (bpm): 156 Fundal Height: 38 cm Movement: Present    Results for orders placed or performed in visit on 08/14/18 (from the past 24 hour(s))  POC Urinalysis Dipstick OB   Collection Time: 08/14/18  2:33 PM  Result Value Ref Range   Color, UA     Clarity, UA     Glucose, UA Negative Negative   Bilirubin, UA     Ketones, UA neg    Spec Grav, UA     Blood, UA neg    pH, UA     POC,PROTEIN,UA Negative  Negative, Trace, Small (1+), Moderate (2+), Large (3+), 4+   Urobilinogen, UA     Nitrite, UA neg    Leukocytes, UA Trace (A) Negative   Appearance     Odor      Assessment & Plan:  1) Low-risk pregnancy G1P0 at [redacted]w[redacted]d with an Estimated Date of Delivery: 08/25/18    Meds: No orders of the defined types were placed in this encounter.  Labs/procedures today: None  Plan:   1. Continue routine obstetrical care  2. BP check 1 week  Reviewed: Term labor symptoms and general obstetric precautions including but not limited to vaginal bleeding, contractions, leaking of fluid and fetal movement were reviewed in detail with the patient.  All questions were answered  Follow-up: Return in about 1 week (around 08/21/2018).  Orders Placed This Encounter  Procedures  . POC Urinalysis Dipstick OB   By signing my name below, I, Arnette Norris, attest that this documentation has been prepared under the direction and in the presence of Tilda Burrow, MD. Electronically Signed: Arnette Norris Medical Scribe. 08/14/18. 2:55 PM.  I personally performed the services described in this documentation, which was SCRIBED in my presence. The recorded information has been reviewed and considered accurate. It has been edited as necessary during review. Tilda Burrow, MD

## 2018-08-21 ENCOUNTER — Other Ambulatory Visit: Payer: Self-pay

## 2018-08-21 ENCOUNTER — Ambulatory Visit (INDEPENDENT_AMBULATORY_CARE_PROVIDER_SITE_OTHER): Payer: PRIVATE HEALTH INSURANCE | Admitting: Obstetrics and Gynecology

## 2018-08-21 ENCOUNTER — Encounter: Payer: Self-pay | Admitting: Obstetrics and Gynecology

## 2018-08-21 VITALS — BP 132/84 | HR 96 | Wt 219.4 lb

## 2018-08-21 DIAGNOSIS — Z1389 Encounter for screening for other disorder: Secondary | ICD-10-CM

## 2018-08-21 DIAGNOSIS — Z3403 Encounter for supervision of normal first pregnancy, third trimester: Secondary | ICD-10-CM

## 2018-08-21 DIAGNOSIS — Z3A39 39 weeks gestation of pregnancy: Secondary | ICD-10-CM

## 2018-08-21 DIAGNOSIS — Z331 Pregnant state, incidental: Secondary | ICD-10-CM

## 2018-08-21 LAB — POCT URINALYSIS DIPSTICK OB
Blood, UA: NEGATIVE
Glucose, UA: NEGATIVE
Ketones, UA: NEGATIVE
Leukocytes, UA: NEGATIVE
Nitrite, UA: NEGATIVE
POC,PROTEIN,UA: NEGATIVE

## 2018-08-21 NOTE — Patient Instructions (Signed)

## 2018-08-21 NOTE — Progress Notes (Signed)
Subjective:  Miranda Campbell is a 23 y.o. G1P0 at [redacted]w[redacted]d being seen today for ongoing prenatal care.  She is currently monitored for the following issues for this low-risk pregnancy and has Supervision of normal first pregnancy; Family history of fragile X syndrome; and Anxiety on their problem list.  Patient reports general discomforts of pregnancy.  Contractions: Not present. Vag. Bleeding: None.  Movement: Present. Denies leaking of fluid.   The following portions of the patient's history were reviewed and updated as appropriate: allergies, current medications, past family history, past medical history, past social history, past surgical history and problem list. Problem list updated.  Objective:   Vitals:   08/21/18 1342  BP: 132/84  Pulse: 96  Weight: 219 lb 6.4 oz (99.5 kg)    Fetal Status:     Movement: Present     General:  Alert, oriented and cooperative. Patient is in no acute distress.  Skin: Skin is warm and dry. No rash noted.   Cardiovascular: Normal heart rate noted  Respiratory: Normal respiratory effort, no problems with respiration noted  Abdomen: Soft, gravid, appropriate for gestational age. Pain/Pressure: Absent     Pelvic:  Cervical exam performed        Extremities: Normal range of motion.  Edema: None  Mental Status: Normal mood and affect. Normal behavior. Normal judgment and thought content.   Urinalysis:      Assessment and Plan:  Pregnancy: G1P0 at [redacted]w[redacted]d  1. Pregnant state, incidental  - POC Urinalysis Dipstick OB  2. Screening for genitourinary condition  - POC Urinalysis Dipstick OB  3. Encounter for supervision of normal first pregnancy in third trimester Stable Labor precautions BPP next d/t post dates Schedule IOL at next visit   Term labor symptoms and general obstetric precautions including but not limited to vaginal bleeding, contractions, leaking of fluid and fetal movement were reviewed in detail with the patient. Please refer to  After Visit Summary for other counseling recommendations.  Return in about 1 week (around 08/28/2018) for OB visit.   Hermina Staggers, MD

## 2018-08-27 ENCOUNTER — Other Ambulatory Visit: Payer: Self-pay | Admitting: Obstetrics and Gynecology

## 2018-08-27 DIAGNOSIS — Z8759 Personal history of other complications of pregnancy, childbirth and the puerperium: Secondary | ICD-10-CM

## 2018-08-27 DIAGNOSIS — O480 Post-term pregnancy: Secondary | ICD-10-CM

## 2018-08-28 ENCOUNTER — Encounter: Payer: Self-pay | Admitting: Obstetrics and Gynecology

## 2018-08-28 ENCOUNTER — Ambulatory Visit (INDEPENDENT_AMBULATORY_CARE_PROVIDER_SITE_OTHER): Payer: PRIVATE HEALTH INSURANCE

## 2018-08-28 ENCOUNTER — Ambulatory Visit (INDEPENDENT_AMBULATORY_CARE_PROVIDER_SITE_OTHER): Payer: PRIVATE HEALTH INSURANCE | Admitting: Obstetrics and Gynecology

## 2018-08-28 ENCOUNTER — Other Ambulatory Visit: Payer: Self-pay

## 2018-08-28 ENCOUNTER — Telehealth: Payer: Self-pay | Admitting: *Deleted

## 2018-08-28 ENCOUNTER — Other Ambulatory Visit: Payer: Self-pay | Admitting: Obstetrics and Gynecology

## 2018-08-28 VITALS — BP 132/88 | HR 89 | Wt 219.0 lb

## 2018-08-28 DIAGNOSIS — Z3A40 40 weeks gestation of pregnancy: Secondary | ICD-10-CM

## 2018-08-28 DIAGNOSIS — O480 Post-term pregnancy: Secondary | ICD-10-CM

## 2018-08-28 DIAGNOSIS — Z1389 Encounter for screening for other disorder: Secondary | ICD-10-CM

## 2018-08-28 DIAGNOSIS — Z331 Pregnant state, incidental: Secondary | ICD-10-CM

## 2018-08-28 DIAGNOSIS — Z3403 Encounter for supervision of normal first pregnancy, third trimester: Secondary | ICD-10-CM

## 2018-08-28 LAB — POCT URINALYSIS DIPSTICK OB
Glucose, UA: NEGATIVE
Ketones, UA: NEGATIVE
Leukocytes, UA: NEGATIVE
Nitrite, UA: NEGATIVE
POC,PROTEIN,UA: NEGATIVE
RBC UA: NEGATIVE

## 2018-08-28 NOTE — Progress Notes (Signed)
Patient ID: Yalitza Foxworthy, female   DOB: 1996/04/10, 23 y.o.   MRN: [id number]    LOW-RISK PREGNANCY VISIT Patient name: Miranda Campbell MRN [id number]  Date of birth: [birth year] Chief Complaint:   Routine Prenatal Visit (u/s today)  History of Present Illness:   Miranda Campbell is a 23 y.o. G1P0 female at [redacted]w[redacted]d with an Estimated Date of Delivery: 08/25/18 being seen today for ongoing management of a low-risk pregnancy.  Today she reports no complaints. Contractions: Not present. Vag. Bleeding: None.  Movement: Present. denies leaking of fluid. Review of Systems:   Pertinent items are noted in HPI Denies abnormal vaginal discharge w/ itching/odor/irritation, headaches, visual changes, shortness of breath, chest pain, abdominal pain, severe nausea/vomiting, or problems with urination or bowel movements unless otherwise stated above. Pertinent History Reviewed:  Reviewed past medical,surgical, social, obstetrical and family history.  Reviewed problem list, medications and allergies. Physical Assessment:   Vitals:   08/28/18 1441  BP: 132/88  Pulse: 89  Weight: 219 lb (99.3 kg)  Body mass index is 38.79 kg/m.        Physical Examination:   General appearance: Well appearing, and in no distress  Mental status: Alert, oriented to person, place, and time  Skin: Warm & dry  Cardiovascular: Normal heart rate noted  Respiratory: Normal respiratory effort, no distress  Abdomen: Soft, gravid, nontender  Pelvic: Cervical exam performed 1 cm soft,          Extremities: Edema: None  Fetal Status: Fetal Heart Rate (bpm): 130 u/s Fundal Height: 39 cm Movement: Present    Results for orders placed or performed in visit on 08/28/18 (from the past 24 hour(s))  POC Urinalysis Dipstick OB   Collection Time: 08/28/18  2:41 PM  Result Value Ref Range   Color, UA     Clarity, UA     Glucose, UA Negative Negative   Bilirubin, UA     Ketones, UA neg    Spec Grav, UA     Blood, UA neg    pH, UA      POC,PROTEIN,UA Negative Negative, Trace, Small (1+), Moderate (2+), Large (3+), 4+   Urobilinogen, UA     Nitrite, UA neg    Leukocytes, UA Negative Negative   Appearance     Odor      Assessment & Plan:  1) Low-risk pregnancy G1P0 at [redacted]w[redacted]d with an Estimated Date of Delivery: 08/25/18     Meds: No orders of the defined types were placed in this encounter.  Labs/procedures today: [place] 40+3 wks,cephalic,fhr 130 bpm,afi 10 cm,anterior placenta gr 3,normal ovaries bilat,BPP 8/8  Plan:   1. IOL 09/01/2018 at midnight 2. 4 weeks PP visit  Reviewed: Term labor symptoms and general obstetric precautions including but not limited to vaginal bleeding, contractions, leaking of fluid and fetal movement were reviewed in detail with the patient.  All questions were answered  Follow-up: No follow-ups on file.  Orders Placed This Encounter  Procedures  . POC Urinalysis Dipstick OB   By signing my name below, I, Arnette Norris, attest that this documentation has been prepared under the direction and in the presence of Tilda Burrow, MD. Electronically Signed: Arnette Norris Medical Scribe. 08/28/18. 2:52 PM.  I personally performed the services described in this documentation, which was SCRIBED in my presence. The recorded information has been reviewed and considered accurate. It has been edited as necessary during review. Tilda Burrow, MD

## 2018-08-28 NOTE — Telephone Encounter (Signed)
Pt informed of visitor restrictions. Denies having contact with anyone with suspected or confirmed COVID-19 in the last month. Denies fever, cough, SOB, muscle pain, diarrhea, rash, vomiting, abdominal pain, red eye, weakness, bruising or bleeding, joint pain, or severe headache. 

## 2018-08-28 NOTE — Progress Notes (Signed)
[place] 40+3 wks,cephalic,fhr 130 bpm,afi 10 cm,anterior placenta gr 3,normal ovaries bilat,BPP 8/8

## 2018-08-29 ENCOUNTER — Telehealth (HOSPITAL_COMMUNITY): Payer: Self-pay | Admitting: *Deleted

## 2018-08-29 ENCOUNTER — Other Ambulatory Visit (HOSPITAL_COMMUNITY): Payer: Self-pay | Admitting: Advanced Practice Midwife

## 2018-08-29 NOTE — Telephone Encounter (Signed)
Preadmission screen  

## 2018-09-01 ENCOUNTER — Encounter (HOSPITAL_COMMUNITY): Payer: Self-pay

## 2018-09-01 ENCOUNTER — Inpatient Hospital Stay (HOSPITAL_COMMUNITY): Payer: PRIVATE HEALTH INSURANCE

## 2018-09-01 ENCOUNTER — Inpatient Hospital Stay (HOSPITAL_COMMUNITY): Payer: PRIVATE HEALTH INSURANCE | Admitting: Anesthesiology

## 2018-09-01 ENCOUNTER — Other Ambulatory Visit: Payer: Self-pay

## 2018-09-01 ENCOUNTER — Inpatient Hospital Stay (HOSPITAL_COMMUNITY)
Admission: AD | Admit: 2018-09-01 | Discharge: 2018-09-04 | DRG: 806 | Disposition: A | Discharge status: Home / Self Care | Payer: PRIVATE HEALTH INSURANCE | Attending: Obstetrics and Gynecology | Admitting: Obstetrics and Gynecology

## 2018-09-01 DIAGNOSIS — Z3A41 41 weeks gestation of pregnancy: Secondary | ICD-10-CM | POA: Diagnosis not present

## 2018-09-01 DIAGNOSIS — O480 Post-term pregnancy: Principal | ICD-10-CM | POA: Diagnosis present

## 2018-09-01 LAB — CBC
HCT: 37.7 % (ref 36.0–46.0)
Hemoglobin: 12.2 g/dL (ref 12.0–15.0)
MCH: 29.3 pg (ref 26.0–34.0)
MCHC: 32.4 g/dL (ref 30.0–36.0)
MCV: 90.6 fL (ref 80.0–100.0)
PLATELETS: 263 10*3/uL (ref 150–400)
RBC: 4.16 MIL/uL (ref 3.87–5.11)
RDW: 13.9 % (ref 11.5–15.5)
WBC: 11.5 10*3/uL — ABNORMAL HIGH (ref 4.0–10.5)
nRBC: 0 % (ref 0.0–0.2)

## 2018-09-01 LAB — TYPE AND SCREEN
ABO/RH(D): A POS
Antibody Screen: NEGATIVE

## 2018-09-01 LAB — ABO/RH: ABO/RH(D): A POS

## 2018-09-01 LAB — RPR: RPR Ser Ql: NONREACTIVE

## 2018-09-01 MED ORDER — TERBUTALINE SULFATE 1 MG/ML IJ SOLN
0.2500 mg | Freq: Once | INTRAMUSCULAR | Status: AC | PRN
  Administered 2018-09-01: 0.25 mg via SUBCUTANEOUS
  Filled 2018-09-01: qty 1

## 2018-09-01 MED ORDER — PHENYLEPHRINE 40 MCG/ML (10ML) SYRINGE FOR IV PUSH (FOR BLOOD PRESSURE SUPPORT): 80.0000 ug | PREFILLED_SYRINGE | INTRAVENOUS | Status: DC | PRN

## 2018-09-01 MED ORDER — EPHEDRINE 5 MG/ML INJ: 10.0000 mg | INTRAVENOUS | Status: DC | PRN

## 2018-09-01 MED ORDER — SODIUM CHLORIDE (PF) 0.9 % IJ SOLN
INTRAMUSCULAR | Status: DC | PRN
  Administered 2018-09-01: 12 mL/h via EPIDURAL

## 2018-09-01 MED ORDER — LIDOCAINE HCL (PF) 1 % IJ SOLN: 30.0000 mL | INTRAMUSCULAR | Status: DC | PRN

## 2018-09-01 MED ORDER — PHENYLEPHRINE 40 MCG/ML (10ML) SYRINGE FOR IV PUSH (FOR BLOOD PRESSURE SUPPORT)
80.0000 ug | PREFILLED_SYRINGE | INTRAVENOUS | Status: DC | PRN
  Filled 2018-09-01: qty 10

## 2018-09-01 MED ORDER — LACTATED RINGERS IV SOLN: 500.0000 mL | Freq: Once | INTRAVENOUS | Status: DC

## 2018-09-01 MED ORDER — MISOPROSTOL 25 MCG QUARTER TABLET: 25.0000 ug | ORAL_TABLET | ORAL | Status: DC | PRN

## 2018-09-01 MED ORDER — MISOPROSTOL 50MCG HALF TABLET
ORAL_TABLET | ORAL | Status: AC
  Administered 2018-09-01: 50 ug via ORAL
  Filled 2018-09-01: qty 1

## 2018-09-01 MED ORDER — OXYCODONE-ACETAMINOPHEN 5-325 MG PO TABS: 1.0000 | ORAL_TABLET | ORAL | Status: DC | PRN

## 2018-09-01 MED ORDER — SOD CITRATE-CITRIC ACID 500-334 MG/5ML PO SOLN
30.0000 mL | ORAL | Status: DC | PRN
  Filled 2018-09-01: qty 15

## 2018-09-01 MED ORDER — MISOPROSTOL 50MCG HALF TABLET
50.0000 ug | ORAL_TABLET | Freq: Once | ORAL | Status: AC
  Administered 2018-09-01: 50 ug via ORAL

## 2018-09-01 MED ORDER — OXYTOCIN 40 UNITS IN NORMAL SALINE INFUSION - SIMPLE MED
2.5000 [IU]/h | INTRAVENOUS | Status: DC
  Administered 2018-09-02: 2.5 [IU]/h via INTRAVENOUS
  Filled 2018-09-01: qty 1000

## 2018-09-01 MED ORDER — OXYCODONE-ACETAMINOPHEN 5-325 MG PO TABS: 2.0000 | ORAL_TABLET | ORAL | Status: DC | PRN

## 2018-09-01 MED ORDER — FENTANYL-BUPIVACAINE-NACL 0.5-0.125-0.9 MG/250ML-% EP SOLN
12.0000 mL/h | EPIDURAL | Status: DC | PRN
  Filled 2018-09-01: qty 250

## 2018-09-01 MED ORDER — DIPHENHYDRAMINE HCL 50 MG/ML IJ SOLN: 12.5000 mg | INTRAMUSCULAR | Status: DC | PRN

## 2018-09-01 MED ORDER — LACTATED RINGERS IV SOLN
INTRAVENOUS | Status: DC
  Administered 2018-09-01: 125 mL/h via INTRAVENOUS
  Administered 2018-09-01 (×3): via INTRAVENOUS
  Administered 2018-09-01: 125 mL/h via INTRAVENOUS

## 2018-09-01 MED ORDER — ONDANSETRON HCL 4 MG/2ML IJ SOLN: 4.0000 mg | Freq: Four times a day (QID) | INTRAMUSCULAR | Status: DC | PRN

## 2018-09-01 MED ORDER — FENTANYL-BUPIVACAINE-NACL 0.5-0.125-0.9 MG/250ML-% EP SOLN: 12.0000 mL/h | EPIDURAL | Status: DC | PRN

## 2018-09-01 MED ORDER — FLEET ENEMA 7-19 GM/118ML RE ENEM: 1.0000 | ENEMA | RECTAL | Status: DC | PRN

## 2018-09-01 MED ORDER — MISOPROSTOL 50MCG HALF TABLET: 50.0000 ug | ORAL_TABLET | ORAL | Status: DC

## 2018-09-01 MED ORDER — TERBUTALINE SULFATE 1 MG/ML IJ SOLN: 0.2500 mg | Freq: Once | INTRAMUSCULAR | Status: DC | PRN

## 2018-09-01 MED ORDER — LIDOCAINE HCL (PF) 1 % IJ SOLN
INTRAMUSCULAR | Status: DC | PRN
  Administered 2018-09-01: 5 mL via EPIDURAL

## 2018-09-01 MED ORDER — MISOPROSTOL 50MCG HALF TABLET
ORAL_TABLET | ORAL | Status: AC
  Filled 2018-09-01: qty 1

## 2018-09-01 MED ORDER — ACETAMINOPHEN 325 MG PO TABS: 650.0000 mg | ORAL_TABLET | ORAL | Status: DC | PRN

## 2018-09-01 MED ORDER — LACTATED RINGERS IV SOLN
500.0000 mL | INTRAVENOUS | Status: DC | PRN
  Administered 2018-09-01 – 2018-09-02 (×3): 500 mL via INTRAVENOUS

## 2018-09-01 MED ORDER — OXYTOCIN 40 UNITS IN NORMAL SALINE INFUSION - SIMPLE MED
1.0000 m[IU]/min | INTRAVENOUS | Status: DC
  Administered 2018-09-01: 2 m[IU]/min via INTRAVENOUS
  Administered 2018-09-01: 14 m[IU]/min via INTRAVENOUS
  Administered 2018-09-02: 6 m[IU]/min via INTRAVENOUS
  Filled 2018-09-01: qty 1000

## 2018-09-01 MED ORDER — OXYTOCIN BOLUS FROM INFUSION
500.0000 mL | Freq: Once | INTRAVENOUS | Status: AC
  Administered 2018-09-02: 500 mL via INTRAVENOUS

## 2018-09-01 MED ORDER — MISOPROSTOL 50MCG HALF TABLET
50.0000 ug | ORAL_TABLET | ORAL | Status: DC | PRN
  Administered 2018-09-01: 50 ug via ORAL

## 2018-09-01 NOTE — Progress Notes (Signed)
LABOR PROGRESS NOTE  Miranda Campbell is a 23 y.o. G1P0 at [redacted]w[redacted]d  admitted for IOL for postdates  Subjective: Doing well Not much pain with contractions  Objective: BP 116/79   Pulse 62   Temp 98.1 F (36.7 C) (Oral)   Resp 16   Ht 5\' 3"  (1.6 m)   Wt 99.3 kg   LMP 11/11/2017 (Exact Date)   BMI 38.79 kg/m  or  Vitals:   09/01/18 0100 09/01/18 0222 09/01/18 0301 09/01/18 0401  BP: 117/79 121/74 100/87 116/79  Pulse: 83 76 79 62  Resp: 16 16 16 16   Temp:      TempSrc:      Weight:      Height:        Dilation: 1.5 Effacement (%): 60 Station: -3 Presentation: Vertex Exam by:: Dr. Janina Mayo: baseline rate 125, moderate varibility, + acel, no decel Toco: irregular contractions  Labs: Lab Results  Component Value Date   WBC 11.5 (H) 09/01/2018   HGB 12.2 09/01/2018   HCT 37.7 09/01/2018   MCV 90.6 09/01/2018   PLT 263 09/01/2018    Patient Active Problem List   Diagnosis Date Noted  . Pregnancy with 41 completed weeks gestation 09/01/2018  . Supervision of normal first pregnancy 01/28/2018  . Family history of fragile X syndrome 01/28/2018  . Anxiety 01/28/2018    Assessment / Plan: 23 y.o. G1P0 at [redacted]w[redacted]d here for IOL for PD  Labor: FB placed. Continue cytotec Fetal Wellbeing:  Cat 1 Pain Control:  Comfortable now Anticipated MOD:  SVD  Yasha Tibbett,MD OB Fellow  09/01/2018, 4:53 AM

## 2018-09-01 NOTE — Progress Notes (Signed)
OB/GYN Faculty Practice: Labor Progress Note  Subjective: Very comfortable with epidural. Not feeling any pressure. FOB at bedside for support.   Objective: BP (!) 97/51 (BP Location: Right Arm)   Pulse 70   Temp 98.5 F (36.9 C) (Oral)   Resp 16   Ht 5\' 3"  (1.6 m)   Wt 99.3 kg   LMP 11/11/2017 (Exact Date)   SpO2 99%   BMI 38.79 kg/m  Gen: well appearing, no distress Dilation: 6 Effacement (%): 80 Station: -1 Presentation: Vertex Exam by:: J Middleton RN  Assessment and Plan: 23 y.o. G1P0 [redacted]w[redacted]d here for IOL for postdates  Labor: continue pitocin -- pain control: epidural in place -- PPH Risk: low  Fetal Well-Being: EFW 7lbs by Leopolds. Cephalic by prior check.  -- Category 1 - continuous fetal monitoring  -- GBS negative  Burman Nieves, MD Family Medicine Resident 3:00 PM

## 2018-09-01 NOTE — Progress Notes (Signed)
OB/GYN Faculty Practice: Labor Progress Note  Subjective: Doing well. Can tell having regular contractions but not very uncomfortable yet.   Objective: BP 124/73   Pulse 68   Temp 98.1 F (36.7 C) (Oral)   Resp 16   Ht 5\' 3"  (1.6 m)   Wt 99.3 kg   LMP 11/11/2017 (Exact Date)   BMI 38.79 kg/m  Gen: well-appearing, NAD Dilation: 5 Effacement (%): 70 Station: -2 Presentation: Vertex Exam by:: Miranda Campbell  Assessment and Plan: 23 y.o. G1P0 [redacted]w[redacted]d here for PDIOL.  Labor: Induction started at midnight with FB and cytotec. FB now out, started pitocin at 0900.  -- pain control: ultimately desires epidural -- PPH Risk: low  Fetal Well-Being: EFW 7lbs by Leopolds. Cephalic by prior checks.  -- Category I - continuous fetal monitoring  -- GBS negative    Laurel S. Earlene Plater, DO OB/GYN Fellow, Faculty Practice  10:36 AM

## 2018-09-01 NOTE — Anesthesia Procedure Notes (Signed)
Epidural Patient location during procedure: OB Start time: 09/01/2018 1:14 PM End time: 09/01/2018 1:29 PM  Staffing Anesthesiologist: Trevor Iha, MD Performed: anesthesiologist   Preanesthetic Checklist Completed: patient identified, site marked, surgical consent, pre-op evaluation, timeout performed, IV checked, risks and benefits discussed and monitors and equipment checked  Epidural Patient position: sitting Prep: site prepped and draped and DuraPrep Patient monitoring: continuous pulse ox and blood pressure Approach: midline Location: L3-L4 Injection technique: LOR air  Needle:  Needle type: Tuohy  Needle gauge: 17 G Needle length: 9 cm and 9 Needle insertion depth: 7 cm Catheter type: closed end flexible Catheter size: 19 Gauge Catheter at skin depth: 12 cm Test dose: negative  Assessment Events: blood not aspirated, injection not painful, no injection resistance, negative IV test and no paresthesia  Additional Notes Patient identified. Risks/Benefits/Options discussed with patient including but not limited to bleeding, infection, nerve damage, paralysis, failed block, incomplete pain control, headache, blood pressure changes, nausea, vomiting, reactions to medication both or allergic, itching and postpartum back pain. Confirmed with bedside nurse the patient's most recent platelet count. Confirmed with patient that they are not currently taking any anticoagulation, have any bleeding history or any family history of bleeding disorders. Patient expressed understanding and wished to proceed. All questions were answered. Sterile technique was used throughout the entire procedure. Please see nursing notes for vital signs. Test dose was given through epidural needle and negative prior to continuing to dose epidural or start infusion. Warning signs of high block given to the patient including shortness of breath, tingling/numbness in hands, complete motor block, or any  concerning symptoms with instructions to call for help. Patient was given instructions on fall risk and not to get out of bed. All questions and concerns addressed with instructions to call with any issues. 1 Attempt (S) . Patient tolerated procedure well.

## 2018-09-01 NOTE — Progress Notes (Signed)
CNM called to bedside for prolonged deceleration. Patient feeling increased pain and had just pressed epidural button. Position changed and FHR recovered but now tachycardic with a baseline of 190 bpm. FSE applied, pitocin stopped, O2 applied and terbutaline given. Dr. Vergie Living notified of fetal status change. Monitored for 10 minutes and FHR resolved to a baseline of 170. Will recheck in 1 hour unless FHR increases before then.   Rolm Bookbinder, CNM 09/01/18 11:24 PM

## 2018-09-01 NOTE — Progress Notes (Signed)
Labor Progress Note Miranda Campbell is a 23 y.o. G1P0 at [redacted]w[redacted]d presented for IOL for postdates  S:  Patient reporting increased pressure  O:  BP 117/73   Pulse 73   Temp 98.2 F (36.8 C) (Oral)   Resp 16   Ht 5\' 3"  (1.6 m)   Wt 99.3 kg   LMP 11/11/2017 (Exact Date)   SpO2 99%   BMI 38.79 kg/m    Fetal Tracing:  Baseline: 130 Variability: moderate Accels: 15x15 Decels: early  Toco: 1-3   CVE: Dilation: 7.5 Effacement (%): 80 Station: -1 Presentation: Vertex Exam by:: Rayfield Citizen, CNM    A&P: 23 y.o. G1P0 [redacted]w[redacted]d IOL for postdates #Labor: Progressing well. Will continue pitocin and recheck in 2-3 hours #Pain: epidural #FWB: Cat 1 #GBS negative  Rolm Bookbinder, CNM 8:56 PM

## 2018-09-01 NOTE — Progress Notes (Signed)
OB/GYN Faculty Practice: Labor Progress Note  Subjective: Comfortable with epidural, not feeling any contractions. FOB at bedside.   Objective: BP 119/75 (BP Location: Right Arm)   Pulse 84   Temp 98.5 F (36.9 C) (Oral)   Resp 14   Ht 5\' 3"  (1.6 m)   Wt 99.3 kg   LMP 11/11/2017 (Exact Date)   SpO2 99%   BMI 38.79 kg/m  Gen: well appearing, no distress Dilation: 6 Effacement (%): 80 Station: -1 Presentation: Vertex Exam by:: MD Darin Engels  Assessment and Plan: 23 y.o. G1P0 [redacted]w[redacted]d here for IOL for postdates  Labor: small forebag ruptured with clear fluid, IUPC placed, Pitocin currently at 10, uptitrate 2x2 until adequate contractions -- pain control: epidural in place -- PPH Risk: low  Fetal Well-Being: EFW 7lbs by leopolds. Cephalic by exam.  -- Category 2 - continuous fetal monitoring  -- GBS negative   Burman Nieves, MD Family Medicine Resident 6:07 PM

## 2018-09-01 NOTE — H&P (Signed)
LABOR AND DELIVERY ADMISSION HISTORY AND PHYSICAL NOTE  Miranda Campbell is a 23 y.o. female G1P0 with IUP at [redacted]w[redacted]d by LMP=7w [place] presenting for IOL for postdates.  She reports positive fetal movement. She denies leakage of fluid or vaginal bleeding.  Prenatal History/Complications: PNC at [facility] Pregnancy complications:  - None  Past Medical History: Past Medical History:  Diagnosis Date  . Medical history non-contributory     Past Surgical History: Past Surgical History:  Procedure Laterality Date  . NO PAST SURGERIES      Obstetrical History: OB History    Gravida  1   Para      Term      Preterm      AB      Living        SAB      TAB      Ectopic      Multiple      Live Births              Social History: Social History   Socioeconomic History  . Marital status: Married    Spouse name: Miranda Campbell  . Number of children: 0  . Years of education: 4  . Highest education level: High school graduate  Occupational History  . Not on file  Social Needs  . Financial resource strain: Not hard at all  . Food insecurity:    Worry: Never true    Inability: Never true  . Transportation needs:    Medical: No    Non-medical: No  Tobacco Use  . Smoking status: Never Smoker  . Smokeless tobacco: Never Used  Substance and Sexual Activity  . Alcohol use: Yes  . Drug use: Not Currently  . Sexual activity: Yes    Birth control/protection: None  Lifestyle  . Physical activity:    Days per week: 2 days    Minutes per session: 30 min  . Stress: To some extent  Relationships  . Social connections:    Talks on phone: More than three times a week    Gets together: Twice a week    Attends religious service: Never    Active member of club or organization: No    Attends meetings of clubs or organizations: Never    Relationship status: Married  Other Topics Concern  . Not on file  Social History Narrative  . Not on file    Family  History: Family History  Problem Relation Age of Onset  . Spina bifida Mother     Allergies: No Known Allergies  Medications Prior to Admission  Medication Sig Dispense Refill Last Dose  . Prenatal Vit-DSS-Fe Cbn-FA (PRENATAL AD PO) Take by mouth.   Taking     Review of Systems  All systems reviewed and negative except as stated in HPI  Physical Exam Last menstrual period 11/11/2017. General appearance: alert, oriented, NAD Lungs: normal respiratory effort Heart: regular rate Abdomen: soft, non-tender; gravid, FH appropriate for GA Extremities: No calf swelling or tenderness Presentation: cephalic Fetal monitoring: baseline 130s/mod var/+ acels/no decels Uterine activity: irregular contractions    Prenatal labs: ABO, Rh: A/Positive/-- (08/26 1552) Antibody: Negative (12/30 0914) Rubella: 1.24 (08/26 1552) RPR: Non Reactive (12/30 0914)  HBsAg: Negative (08/26 1552)  HIV: Non Reactive (12/30 0914)  GC/Chlamydia: negative GBS:   negative 2-hr GTT: 80/401/137 Genetic screening:  negative Anatomy US: normal  Prenatal Transfer Tool  Maternal Diabetes: No Genetic Screening: Normal Maternal Ultrasounds/Referrals: Normal Fetal Ultrasounds or other  Referrals:  None Maternal Substance Abuse:  No Significant Maternal Medications:  None Significant Maternal Lab Results: None  No results found for this or any previous visit (from the past 24 hour(s)).  Patient Active Problem List   Diagnosis Date Noted  . Supervision of normal first pregnancy 01/28/2018  . Family history of fragile X syndrome 01/28/2018  . Anxiety 01/28/2018    Assessment: Miranda Campbell is a 23 y.o. G1P0 at [redacted]w[redacted]d here for IOL for postdates  #Labor: Start with cytotec. Plan for FB with next check.  #Pain: Comfortable now. Desires epidural later.  #FWB: Cat 1 #ID:  GBS negative #MOF: breast #MOC: Condoms #Circ:  NA  Tyshun Tuckerman,MD 09/01/2018, 12:11 AM

## 2018-09-01 NOTE — Anesthesia Preprocedure Evaluation (Addendum)
Anesthesia Evaluation  Patient identified by MRN, date of birth, ID band Patient awake    Reviewed: Allergy & Precautions, NPO status , Patient's Chart, lab work & pertinent test results  Airway Mallampati: II  TM Distance: >3 FB Neck ROM: Full    Dental no notable dental hx. (+) Teeth Intact   Pulmonary neg pulmonary ROS,    Pulmonary exam normal breath sounds clear to auscultation       Cardiovascular Exercise Tolerance: Good Normal cardiovascular exam Rhythm:Regular Rate:Normal     Neuro/Psych    GI/Hepatic Neg liver ROS,   Endo/Other  negative endocrine ROS  Renal/GU negative Renal ROS     Musculoskeletal   Abdominal   Peds  Hematology Hgb 12.2 Plts 263   Anesthesia Other Findings   Reproductive/Obstetrics (+) Pregnancy                            Anesthesia Physical Anesthesia Plan  ASA: III  Anesthesia Plan: Epidural   Post-op Pain Management:    Induction:   PONV Risk Score and Plan:   Airway Management Planned:   Additional Equipment:   Intra-op Plan:   Post-operative Plan:   Informed Consent: I have reviewed the patients History and Physical, chart, labs and discussed the procedure including the risks, benefits and alternatives for the proposed anesthesia with the patient or authorized representative who has indicated his/her understanding and acceptance.       Plan Discussed with:   Anesthesia Plan Comments: (41 wk G1P0 w inc BMI for LEA)       Anesthesia Quick Evaluation

## 2018-09-02 ENCOUNTER — Encounter (HOSPITAL_COMMUNITY): Payer: Self-pay

## 2018-09-02 DIAGNOSIS — Z3A41 41 weeks gestation of pregnancy: Secondary | ICD-10-CM

## 2018-09-02 DIAGNOSIS — O480 Post-term pregnancy: Secondary | ICD-10-CM

## 2018-09-02 MED ORDER — ZOLPIDEM TARTRATE 5 MG PO TABS: 5.0000 mg | ORAL_TABLET | Freq: Every evening | ORAL | Status: DC | PRN

## 2018-09-02 MED ORDER — DIPHENHYDRAMINE HCL 25 MG PO CAPS: 25.0000 mg | ORAL_CAPSULE | Freq: Four times a day (QID) | ORAL | Status: DC | PRN

## 2018-09-02 MED ORDER — IBUPROFEN 600 MG PO TABS
600.0000 mg | ORAL_TABLET | Freq: Four times a day (QID) | ORAL | Status: DC
  Administered 2018-09-02 – 2018-09-04 (×10): 600 mg via ORAL
  Filled 2018-09-02 (×10): qty 1

## 2018-09-02 MED ORDER — SODIUM CHLORIDE 0.9 % IV SOLN
2.0000 g | Freq: Four times a day (QID) | INTRAVENOUS | Status: DC
  Administered 2018-09-02: 2 g via INTRAVENOUS
  Filled 2018-09-02 (×2): qty 2000

## 2018-09-02 MED ORDER — GENTAMICIN SULFATE 40 MG/ML IJ SOLN
180.0000 mg | Freq: Three times a day (TID) | INTRAVENOUS | Status: DC
  Administered 2018-09-02: 180 mg via INTRAVENOUS
  Filled 2018-09-02 (×2): qty 4.5

## 2018-09-02 MED ORDER — PRENATAL MULTIVITAMIN CH
1.0000 | ORAL_TABLET | Freq: Every day | ORAL | Status: DC
  Administered 2018-09-02 – 2018-09-04 (×3): 1 via ORAL
  Filled 2018-09-02 (×3): qty 1

## 2018-09-02 MED ORDER — SIMETHICONE 80 MG PO CHEW: 80.0000 mg | CHEWABLE_TABLET | ORAL | Status: DC | PRN

## 2018-09-02 MED ORDER — SENNOSIDES-DOCUSATE SODIUM 8.6-50 MG PO TABS
2.0000 | ORAL_TABLET | ORAL | Status: DC
  Administered 2018-09-02 – 2018-09-03 (×2): 2 via ORAL
  Filled 2018-09-02 (×2): qty 2

## 2018-09-02 MED ORDER — COCONUT OIL OIL: 1.0000 "application " | TOPICAL_OIL | Status: DC | PRN

## 2018-09-02 MED ORDER — ACETAMINOPHEN 325 MG PO TABS: 650.0000 mg | ORAL_TABLET | ORAL | Status: DC | PRN

## 2018-09-02 MED ORDER — ONDANSETRON HCL 4 MG/2ML IJ SOLN: 4.0000 mg | INTRAMUSCULAR | Status: DC | PRN

## 2018-09-02 MED ORDER — WITCH HAZEL-GLYCERIN EX PADS: 1.0000 "application " | MEDICATED_PAD | CUTANEOUS | Status: DC | PRN

## 2018-09-02 MED ORDER — ACETAMINOPHEN 500 MG PO TABS
1000.0000 mg | ORAL_TABLET | Freq: Once | ORAL | Status: AC
  Administered 2018-09-02: 1000 mg via ORAL
  Filled 2018-09-02: qty 2

## 2018-09-02 MED ORDER — DIBUCAINE 1 % RE OINT: 1.0000 "application " | TOPICAL_OINTMENT | RECTAL | Status: DC | PRN

## 2018-09-02 MED ORDER — BENZOCAINE-MENTHOL 20-0.5 % EX AERO
1.0000 "application " | INHALATION_SPRAY | CUTANEOUS | Status: DC | PRN
  Filled 2018-09-02: qty 56

## 2018-09-02 MED ORDER — ONDANSETRON HCL 4 MG PO TABS: 4.0000 mg | ORAL_TABLET | ORAL | Status: DC | PRN

## 2018-09-02 MED ORDER — TETANUS-DIPHTH-ACELL PERTUSSIS 5-2.5-18.5 LF-MCG/0.5 IM SUSP: 0.5000 mL | Freq: Once | INTRAMUSCULAR | Status: DC

## 2018-09-02 MED ORDER — LACTATED RINGERS AMNIOINFUSION
INTRAVENOUS | Status: DC
  Administered 2018-09-02: 02:00:00 via INTRAUTERINE

## 2018-09-02 NOTE — Lactation Note (Signed)
This note was copied from a baby's chart. Lactation Consultation Note Baby 19 hrs old. Mom stated she used a NS the RN gave her baby latched much better. Lt. Areola.nipple very compressible. Everts well w/stimulation. LC feels the baby will be able to BF well on this breast if would suck. LC feels the baby would be able to obtain a deep latch w/o NS. The Rt. Nipple has some edema. Mom wearing shells, helping w/edema. Nipple and areola more compressible after massaging. Encouraged mom to pre-pump and finger stimulation to nipples prior to latching. LC taught hand expression. Collected about 0.66ml colostrum. Encouraged to rub on baby's gums to stimulate to feed when time.  Discussed feeding habits, milk storage, supply and demand.  Encouraged to call for assistance if needed.  Patient Name: Girl Glendi Totzke [id number] Date: 09/02/2018 Reason for consult: Follow-up assessment;Mother's request;Difficult latch;1st time breastfeeding   Maternal Data    Feeding Feeding Type: Breast Fed  LATCH Score Latch: Too sleepy or reluctant, no latch achieved, no sucking elicited.  Audible Swallowing: None  Type of Nipple: Everted at rest and after stimulation(short shaft)  Comfort (Breast/Nipple): Soft / non-tender  Hold (Positioning): Full assist, staff holds infant at breast  LATCH Score: 5  Interventions Interventions: Breast feeding basics reviewed;Breast compression;Breast massage;Position options;Hand express;Expressed milk;Pre-pump if needed;Shells  Lactation Tools Discussed/Used Tools: Shells;Pump;Nipple Shields Nipple shield size: 20 Shell Type: Other (comment) Breast pump type: Manual Pump Review: Setup, frequency, and cleaning;Milk Storage Initiated by:: RN Date initiated:: 09/02/18   Consult Status Consult Status: Follow-up Date: 09/03/18 Follow-up type: In-patient    Charyl Dancer 09/02/2018, 11:39 PM

## 2018-09-02 NOTE — Progress Notes (Signed)
RN at bedside after epidural bolus by anesthesia MD. Abrupt deviations from baseline noted. Pt repositioned, O2 given by non-rebreather, IV fluid bolus running. FHR back to baseline at 0215.

## 2018-09-02 NOTE — Progress Notes (Signed)
Labor Progress Note Miranda Campbell is a 23 y.o. G1P0 at [redacted]w[redacted]d presented for IOL for postdates  S:  Patient still reporting pressure in bottom, same as before.  O:  BP 118/67   Pulse (!) 106   Temp (!) 101.4 F (38.6 C) (Oral)   Resp 18   Ht 5\' 3"  (1.6 m)   Wt 99.3 kg   LMP 11/11/2017 (Exact Date)   SpO2 99%   BMI 38.79 kg/m   Fetal Tracing:  Baseline: 140 Variability: moderate Accels: none Decels: variable  Toco: 4-5  CVE: Dilation: 7 Effacement (%): 80 Station: -1 Presentation: Vertex Exam by:: Lake Bells, CNM   A&P: 23 y.o. G1P0 [redacted]w[redacted]d IOL for postdates #Labor: Occasional variables with contractions but overall reassuring. Cervix unchanged. Patient now with fever. Will start ampicillin and gentamycin, will give tylenol. Amnioinfusion ordered.  Dr. Vergie Living updated- recommends maternal fluid bolus and recheck in 1 hour.  #Pain: epidural #FWB: Cat 2 #GBS negative  Rolm Bookbinder, CNM 1:35 AM

## 2018-09-02 NOTE — Anesthesia Postprocedure Evaluation (Signed)
Anesthesia Post Note  Patient: Rosine Langi  Procedure(s) Performed: AN AD HOC LABOR EPIDURAL     Patient location during evaluation: Mother Baby Anesthesia Type: Epidural Level of consciousness: awake, awake and alert and oriented Pain management: pain level controlled Vital Signs Assessment: post-procedure vital signs reviewed and stable Respiratory status: spontaneous breathing and respiratory function stable Cardiovascular status: blood pressure returned to baseline and stable Postop Assessment: no headache Anesthetic complications: no    Last Vitals:  Vitals:   09/02/18 0620 09/02/18 0725  BP: 115/70 117/64  Pulse: 80 83  Resp: 16 17  Temp: 37.1 C 36.8 C  SpO2:      Last Pain:  Vitals:   09/02/18 0725  TempSrc: Oral  PainSc: 0-No pain   Pain Goal:                Epidural/Spinal Function Cutaneous sensation: Normal sensation (09/02/18 0725), Patient able to flex knees: Yes (09/02/18 0725), Patient able to lift hips off bed: Yes (09/02/18 0725), Back pain beyond tenderness at insertion site: No (09/02/18 0725), Progressively worsening motor and/or sensory loss: No (09/02/18 0725), Bowel and/or bladder incontinence post epidural: No (09/02/18 0725)  Izack Hoogland

## 2018-09-02 NOTE — Lactation Note (Signed)
This note was copied from a baby's chart.  Lactation Consultation Note  Patient Name: Miranda Campbell [id number]'O Date: 09/02/2018 Reason for consult: Initial assessment;1st time breastfeeding;Primapara;Term  Visited with P1 Mom of term baby at 40 hrs old.  Baby sleeping STS on Mom's chest.  Baby has not latched to breast yet.  Baby has had 5 stools.    Reviewed breast massage and hand expression.  Mom stated that her nurse helped her earlier.  Recommended she hand express on both breasts 1-2 mins per breast, moving from breast to breast.  Offered to show Mom, but she reminded LC of how her RN assisted.  Recommended she hand express into spoon.    Lactation brochure given to Mom.  Mom aware of IP and OP lactation support available to her.  Encouraged to call prn when baby cues.   Consult Status Consult Status: Follow-up Date: 09/03/18 Follow-up type: In-patient    Miranda Campbell 09/02/2018, 6:00 PM

## 2018-09-02 NOTE — Progress Notes (Addendum)
Pharmacy Antibiotic Note  Miranda Campbell is a 23 y.o. female admitted on 09/01/2018 for IOL for postdates. Patient now with maternal fever and  suspected chorioamionitis.  Pharmacy has been consulted for gentamicin dosing.  Plan: Gentamicin 180mg  IV Q8 hours  Height: 5\' 3"  (160 cm) Weight: 219 lb (99.3 kg) IBW/kg (Calculated) : 52.4  Temp (24hrs), Avg:99.1 F (37.3 C), Min:98.1 F (36.7 C), Max:101.4 F (38.6 C)  Recent Labs  Lab 09/01/18 0112  WBC 11.5*    CrCl cannot be calculated (No successful lab value found.).    No Known Allergies  Antimicrobials this admission: Amp 2 grams Q6   >>     Thank you for allowing pharmacy to be a part of this patient's care.  Loyola Mast 09/02/2018 1:40 AM

## 2018-09-02 NOTE — Discharge Summary (Signed)
Postpartum Discharge Summary     Patient Name: Miranda Campbell DOB: [birth year] MRN: [id number]  Date of admission: 09/01/2018 Delivering Provider: Rolm Bookbinder   Date of discharge: 09/04/2018  Admitting diagnosis: pregnancy Intrauterine pregnancy: [redacted]w[redacted]d     Secondary diagnosis:  Principal Problem:   SVD (spontaneous vaginal delivery) Active Problems:   Pregnancy with 41 completed weeks gestation   Obstetric vaginal laceration with first degree perineal laceration  Additional problems: none     Discharge diagnosis: Term Pregnancy Delivered                                                                                                Post partum procedures:none  Augmentation: Pitocin, Cytotec and Foley Balloon  Complications: None  Hospital course:  Induction of Labor With Vaginal Delivery   23 y.o. yo G1P0 at [redacted]w[redacted]d was admitted to the hospital 09/01/2018 for induction of labor.  Indication for induction: Postdates.  Patient had an uncomplicated labor course as follows: Membrane Rupture Time/Date: 11:19 AM ,09/01/2018   Intrapartum Procedures: Episiotomy: None [1]                                         Lacerations:  1st degree [2];Periurethral [8]  Patient had delivery of a Viable infant.  Information for the patient's newborn:  Aara, Klehr Girl Evann [[id number]]  Delivery Method: Vag-Spont   09/02/2018  Details of delivery can be found in separate delivery note.  Patient had a routine postpartum course. Patient is discharged home 09/04/18.  Magnesium Sulfate recieved: No BMZ received: No  Physical exam  Vitals:   09/03/18 0519 09/03/18 1504 09/03/18 2139 09/04/18 0532  BP: 115/67 102/69 108/64 111/72  Pulse: 63 (!) 56 71 71  Resp: 18 18 18 18   Temp: 97.7 F (36.5 C) 98.3 F (36.8 C) 98.2 F (36.8 C) 98.3 F (36.8 C)  TempSrc: Oral Oral Oral Oral  SpO2: 100%  100% 100%  Weight:      Height:       General: alert, cooperative and no distress Lochia:  appropriate Uterine Fundus: firm Incision: N/A DVT Evaluation: No evidence of DVT seen on physical exam. Labs: Lab Results  Component Value Date   WBC 12.8 (H) 09/03/2018   HGB 10.8 (L) 09/03/2018   HCT 32.8 (L) 09/03/2018   MCV 91.4 09/03/2018   PLT 212 09/03/2018   No flowsheet data found.  Discharge instruction: per After Visit Summary and "Baby and Me Booklet".  After visit meds:  Allergies as of 09/04/2018   No Known Allergies     Medication List    TAKE these medications   ibuprofen 600 MG tablet Commonly known as:  ADVIL,MOTRIN Take 1 tablet (600 mg total) by mouth every 6 (six) hours.   PRENATAL AD PO Take 1 tablet by mouth daily.       Diet: routine diet  Activity: Advance as tolerated. Pelvic rest for 6 weeks.   Outpatient follow up:6 weeks Follow up Appt: Future Appointments  Date Time Provider Department Center  09/26/2018  2:00 PM Cresenzo-Dishmon, Scarlette Calico, CNM [facility]   Follow up Visit: Follow-up Information    [facility] Follow up.   Why:  In 4-6 weeks for postpartum visit Contact information: [address] [place] [address] [phone]          Please schedule this patient for PP visit in: 4 weeks Low risk pregnancy complicated by: n/a Delivery mode:  SVD Anticipated Birth Control:  Condoms PP Procedures needed: n/a  Schedule Integrated BH visit: yes, hx anxiety Provider: Any provider  Newborn Data: Live born female  Birth Weight: 8 lb 1.6 oz (3674 g) APGAR: 8, 8  Newborn Delivery   Birth date/time:  09/02/2018 04:09:00 Delivery type:  Vaginal, Spontaneous     Baby Feeding: Breast Disposition:home with mother

## 2018-09-02 NOTE — Progress Notes (Signed)
Cervix unchanged. FHR returned to baseline of 145 with moderate variability. Dr. Vergie Living updated. Recommends restarting pitocin at half and monitoring closely. Patient still very uncomfortable even with epidural. Will have anesthesia come evaluate.  Rolm Bookbinder, CNM 09/02/18 12:16 AM

## 2018-09-03 LAB — CBC
HCT: 32.8 % — ABNORMAL LOW (ref 36.0–46.0)
Hemoglobin: 10.8 g/dL — ABNORMAL LOW (ref 12.0–15.0)
MCH: 30.1 pg (ref 26.0–34.0)
MCHC: 32.9 g/dL (ref 30.0–36.0)
MCV: 91.4 fL (ref 80.0–100.0)
Platelets: 212 10*3/uL (ref 150–400)
RBC: 3.59 MIL/uL — ABNORMAL LOW (ref 3.87–5.11)
RDW: 14.1 % (ref 11.5–15.5)
WBC: 12.8 10*3/uL — ABNORMAL HIGH (ref 4.0–10.5)
nRBC: 0 % (ref 0.0–0.2)

## 2018-09-03 NOTE — Progress Notes (Signed)
CSW received consult for history of anxiety. CSW met with MOB to offer support and complete assessment.    MOB sitting up in bed holding infant with FOB present at bedside when CSW entered the room. MOB looked as if she was preparing to pump and CSW offered to come back at a later time. MOB and FOB requested CSW stay. CSW introduced self and role and received verbal permission to discuss anything in front of FOB. CSW explained reason for consult and MOB nodded in understanding. CSW inquired about MOB's mental health history. Per MOB, she was diagnosed with anxiety in 2015 and described her symptoms as primarily feeling irritable with a history of panic attacks. MOB stated she felt "good" during her pregnancy but is now feeling overwhelmed. MOB attributed this to being a first-time mom and having some difficulty breastfeeding versus her anxiety. CSW normalized MOB's feelings and provided education around the baby blues period vs. perinatal mood disorders, discussed treatment and gave resources for mental health follow up if concerns arise.  CSW recommends self-evaluation during the postpartum time period using the New Mom Checklist from Postpartum Progress and encouraged MOB to contact a medical professional if symptoms are noted at any time.  MOB receptive to education and denied any current SI or HI.   MOB confirmed she has all essentials needed for infant once discharged. MOB reported infant would be sleeping in a basinet. CSW provided review of Sudden Infant Death Syndrome (SIDS) precautions and safe sleeping habits.  MOB and FOB denied any further questions or concerns for CSW at this time. CSW identifies no further need for intervention and no barriers to discharge at this time.  Miranda Campbell, LCSWA  [facility] [phone]   

## 2018-09-03 NOTE — Progress Notes (Signed)
Post Partum Day #1 Subjective: no complaints, up ad lib and tolerating PO; breastfeeding improving; plans on condoms for contraception; would like to go home today, but infant is being watched for potential elevated bili levels  Objective: Blood pressure 115/67, pulse 63, temperature 97.7 F (36.5 C), temperature source Oral, resp. rate 18, height 5\' 3"  (1.6 m), weight 99.3 kg, last menstrual period 11/11/2017, SpO2 100 %, unknown if currently breastfeeding.  Physical Exam:  General: alert, cooperative and no distress Lochia: appropriate Uterine Fundus: firm DVT Evaluation: No evidence of DVT seen on physical exam.  Recent Labs    09/01/18 0112 09/03/18 0432  HGB 12.2 10.8*  HCT 37.7 32.8*    Assessment/Plan: Plan for discharge tomorrow   LOS: 2 days   Arabella Merles CNM 09/03/2018, 11:18 AM

## 2018-09-03 NOTE — Lactation Note (Signed)
This note was copied from a baby's chart.  Lactation Consultation Note  Patient Name: Miranda Campbell [id number]'[id number] Date: 09/03/2018 Reason for consult: Follow-up assessment  Visited with P1 Mom of term baby at 26 hrs old, baby at 4% weight loss.  Mom has struggled to latch baby.  Mom has large, compressible breasts, with erect nipples.  Breast massage and hand expression results in nice flow of colostrum.  Baby has had some small bottles of formula, and last feeding with a curved tip syringe.   Offered to assist and assess baby at the breast.  Mom had double pumped for first time 2 hrs prior.  3 ml of colostrum expressed.  Assisted Mom to finger feed using curved tip syringe.  Baby gagging, and turning away from finger.  Unable to assess lingual frenulum as baby was so fussy.    Pre-pumped and applied the 20 mm nipple shield.  Baby positioned in football hold on left breast.  Baby didn't root, or attempt to latch.  Baby burped and tried again.  Baby's oral cavity seems small, and baby appears to be protecting her mouth.  Does not suckle on hands.    Assisted FOB to pace bottle feed using a slow flow nipple.  Flanged top and bottle lips, baby's mouth opened more.  Baby tolerated the bottle well.    Talked about requesting a SLT evaluation.  Mom very motivated to breastfeed, no complaints, just wanting to do what she needs to do to help baby.   Assisted Mom to pump on initiation setting.  Reviewed cleaning routine and assisted with first cleaning.   Plan- 1- Encouraged STS as much as possible 2- Offer breast with cues, but stop when baby is fussy 3-Supplement with paced bottle using EBM+/formula as needed to equal volume amount per guideline 4- Pump both breasts, adding breast massage and hand expression to collect as much colostrum to feed baby. 5- Love baby at the breast, STS, no pressure to latch baby. 6- ask for help prn.  Requested Speech Evaluation, Jeb Levering.  Consult  Status Consult Status: Follow-up Date: 09/04/18 Follow-up type: In-patient    Miranda Campbell 09/03/2018, 3:38 PM

## 2018-09-04 MED ORDER — IBUPROFEN 600 MG PO TABS: 600.0000 mg | ORAL_TABLET | Freq: Four times a day (QID) | ORAL | 0 refills | Status: DC

## 2018-09-04 NOTE — Lactation Note (Signed)
This note was copied from a baby's chart. Lactation Consultation Note  Patient Name: Miranda Campbell [id number] Date: 09/04/2018 Reason for consult: Follow-up assessment;Other (Comment);Difficult latch;Infant weight loss(post dates 41 weeks )  Baby is 48 hours old  As LC entered the room / mom preparing baby  To feed from a bottle . LC offered to assist to see if she would latch.  Baby awake and hungry. Baby latched easily with assist and the tea hold position and fed for 5 mins with multiple swallows,  Increased with breast compressions/ baby released on her own/ nipple well rounded.  After the doctor exam/ baby woke up and was showing signs of hunger . LC assisted to latch  And added a 33F SNS and  Baby fed for 20 mins and took 24 ml. Per mom comfortable with both latches/ nipple well rounded when baby released.  Mom expressed encouragement with latch without the nipple shield.  Dad was sleeping and LC was not able to show dad how her could assist to obtain the depth.  LC had explained to  Mom how dad could help to latch.  Mom denies soreness/ sore nipple and engorgement prevention and tx reviewed.  Mom will have a hand pump and DEBP at home.  LC encouraged to wear shells between feedings except when sleeping until the baby is consistently  Latching with depth.  LC also encouraged mom to add post pumping both breast for 10 -15 mins to enhance milk coming in.  LC reassured mom is she is unable to latch to try an appetizer of EBM of 10 ml and then latch.  LC provided extra syringes for 33F SNS.  Mom aware the baby should feed with feeding cues and 8-12 times a day if breast feeding. 8-10 times for bottle feeding.  Mom plans to check with her Ped[facility] office to see if they have a LC in the office / if not will call back to [facility]  For [facility] appt. Mother informed of post-discharge support and given phone number to the lactation department, including services for phone call assistance; out-patient  appointments; and breastfeeding support group. List of other breastfeeding resources in the community given in the handout. Encouraged mother to call for problems or concerns related to breastfeeding.   Maternal Data Has patient been taught Hand Expression?: Yes  Feeding Feeding Type: Breast Fed Nipple Type: Slow - flow  LATCH Score Latch: Grasps breast easily, tongue down, lips flanged, rhythmical sucking.  Audible Swallowing: Spontaneous and intermittent  Type of Nipple: Everted at rest and after stimulation  Comfort (Breast/Nipple): Soft / non-tender  Hold (Positioning): Assistance needed to correctly position infant at breast and maintain latch.  LATCH Score: 9  Interventions Interventions: Breast feeding basics reviewed;Assisted with latch;Skin to skin  Lactation Tools Discussed/Used Tools: Shells;Pump Nipple shield size: 20 Shell Type: Inverted WIC Program: No Pump Review: Milk Storage;Setup, frequency, and cleaning(reviewed )   Consult Status Consult Status: Follow-up Date: 09/04/18 Follow-up type: In-patient    Miranda Campbell 09/04/2018, 10:23 AM

## 2018-09-23 ENCOUNTER — Emergency Department (HOSPITAL_COMMUNITY)
Admission: EM | Admit: 2018-09-23 | Discharge: 2018-09-23 | Disposition: A | Discharge status: Home / Self Care | Payer: PRIVATE HEALTH INSURANCE | Attending: Emergency Medicine | Admitting: Emergency Medicine

## 2018-09-23 ENCOUNTER — Telehealth: Payer: Self-pay | Admitting: *Deleted

## 2018-09-23 ENCOUNTER — Emergency Department (HOSPITAL_COMMUNITY): Payer: PRIVATE HEALTH INSURANCE

## 2018-09-23 ENCOUNTER — Other Ambulatory Visit: Payer: Self-pay

## 2018-09-23 ENCOUNTER — Encounter (HOSPITAL_COMMUNITY): Payer: Self-pay | Admitting: *Deleted

## 2018-09-23 DIAGNOSIS — R1013 Epigastric pain: Secondary | ICD-10-CM | POA: Diagnosis present

## 2018-09-23 DIAGNOSIS — R945 Abnormal results of liver function studies: Secondary | ICD-10-CM | POA: Insufficient documentation

## 2018-09-23 DIAGNOSIS — K8020 Calculus of gallbladder without cholecystitis without obstruction: Secondary | ICD-10-CM | POA: Insufficient documentation

## 2018-09-23 DIAGNOSIS — R7989 Other specified abnormal findings of blood chemistry: Secondary | ICD-10-CM

## 2018-09-23 LAB — CBC WITH DIFFERENTIAL/PLATELET
Abs Immature Granulocytes: 0.05 10*3/uL (ref 0.00–0.07)
Basophils Absolute: 0 10*3/uL (ref 0.0–0.1)
Basophils Relative: 0 %
Eosinophils Absolute: 0.1 10*3/uL (ref 0.0–0.5)
Eosinophils Relative: 1 %
HCT: 43 % (ref 36.0–46.0)
Hemoglobin: 14.2 g/dL (ref 12.0–15.0)
Immature Granulocytes: 0 %
Lymphocytes Relative: 18 %
Lymphs Abs: 2.2 10*3/uL (ref 0.7–4.0)
MCH: 30 pg (ref 26.0–34.0)
MCHC: 33 g/dL (ref 30.0–36.0)
MCV: 90.7 fL (ref 80.0–100.0)
Monocytes Absolute: 0.9 10*3/uL (ref 0.1–1.0)
Monocytes Relative: 7 %
Neutro Abs: 9.5 10*3/uL — ABNORMAL HIGH (ref 1.7–7.7)
Neutrophils Relative %: 74 %
Platelets: 348 10*3/uL (ref 150–400)
RBC: 4.74 MIL/uL (ref 3.87–5.11)
RDW: 12.7 % (ref 11.5–15.5)
WBC: 12.8 10*3/uL — ABNORMAL HIGH (ref 4.0–10.5)
nRBC: 0 % (ref 0.0–0.2)

## 2018-09-23 LAB — COMPREHENSIVE METABOLIC PANEL
ALT: 134 U/L — ABNORMAL HIGH (ref 0–44)
AST: 168 U/L — ABNORMAL HIGH (ref 15–41)
Albumin: 3.8 g/dL (ref 3.5–5.0)
Alkaline Phosphatase: 175 U/L — ABNORMAL HIGH (ref 38–126)
Anion gap: 10 (ref 5–15)
BUN: 11 mg/dL (ref 6–20)
CO2: 26 mmol/L (ref 22–32)
Calcium: 9.3 mg/dL (ref 8.9–10.3)
Chloride: 103 mmol/L (ref 98–111)
Creatinine, Ser: 0.65 mg/dL (ref 0.44–1.00)
GFR calc Af Amer: >60 mL/min (ref >60)
GFR calc non Af Amer: >60 mL/min (ref >60)
Glucose, Bld: 86 mg/dL (ref 70–99)
Potassium: 4.1 mmol/L (ref 3.5–5.1)
Sodium: 139 mmol/L (ref 135–145)
Total Bilirubin: 0.6 mg/dL (ref 0.3–1.2)
Total Protein: 7.5 g/dL (ref 6.5–8.1)

## 2018-09-23 LAB — URINALYSIS, ROUTINE W REFLEX MICROSCOPIC
Bilirubin Urine: NEGATIVE
Glucose, UA: NEGATIVE mg/dL
Hgb urine dipstick: NEGATIVE
Ketones, ur: NEGATIVE mg/dL
Leukocytes,Ua: NEGATIVE
Nitrite: NEGATIVE
Protein, ur: NEGATIVE mg/dL
Specific Gravity, Urine: 1.003 — ABNORMAL LOW (ref 1.005–1.030)
pH: 7 (ref 5.0–8.0)

## 2018-09-23 LAB — I-STAT BETA HCG BLOOD, ED (MC, WL, AP ONLY): I-stat hCG, quantitative: <5 m[IU]/mL (ref <5)

## 2018-09-23 LAB — LIPASE, BLOOD: Lipase: 28 U/L (ref 11–51)

## 2018-09-23 MED ORDER — ONDANSETRON 4 MG PO TBDP: 4.0000 mg | ORAL_TABLET | Freq: Three times a day (TID) | ORAL | 0 refills | Status: DC | PRN

## 2018-09-23 MED ORDER — IOHEXOL 300 MG/ML  SOLN
100.0000 mL | Freq: Once | INTRAMUSCULAR | Status: AC | PRN
  Administered 2018-09-23: 100 mL via INTRAVENOUS

## 2018-09-23 MED ORDER — SODIUM CHLORIDE 0.9 % IV BOLUS
500.0000 mL | Freq: Once | INTRAVENOUS | Status: AC
  Administered 2018-09-23: 500 mL via INTRAVENOUS

## 2018-09-23 MED ORDER — DICYCLOMINE HCL 20 MG PO TABS: 20.0000 mg | ORAL_TABLET | Freq: Three times a day (TID) | ORAL | 0 refills | Status: DC | PRN

## 2018-09-23 NOTE — ED Provider Notes (Signed)
Emergency Department Provider Note   I have reviewed the triage vital signs and the nursing notes.   HISTORY  Chief Complaint Abdominal Pain   HPI Miranda Campbell is a 23 y.o. female 3 weeks s/p NSVD with her first pregnancy resents to the emergency department with epigastric and right upper quadrant abdominal pain.  She has associated nausea and vomiting.  She reports intermittent symptoms starting at about week 20 in her pregnancy.  Since delivery, her symptoms have worsened especially over the past several days.  She has had constant pain since eating lunch today.  After eating her pain worsened and she had one episode of nonbloody emesis.  No fevers or shaking chills.  No lower abdominal discomfort.  Denies vaginal bleeding or discharge.  She had no complications with her vaginal delivery.  She is not breast-feeding.  She denies any UTI symptoms.  Pain radiates to the back.  No other modifying factors.   Past Medical History:  Diagnosis Date  . Medical history non-contributory     Patient Active Problem List   Diagnosis Date Noted  . SVD (spontaneous vaginal delivery) 09/02/2018  . Obstetric vaginal laceration with first degree perineal laceration 09/02/2018  . Pregnancy with 41 completed weeks gestation 09/01/2018  . Supervision of normal first pregnancy 01/28/2018  . Family history of fragile X syndrome 01/28/2018  . Anxiety 01/28/2018    Past Surgical History:  Procedure Laterality Date  . NO PAST SURGERIES      Allergies Patient has no known allergies.  Family History  Problem Relation Age of Onset  . Spina bifida Mother     Social History Social History   Tobacco Use  . Smoking status: Never Smoker  . Smokeless tobacco: Never Used  Substance Use Topics  . Alcohol use: Yes    Comment: occasionally   . Drug use: Never    Review of Systems  Constitutional: No fever/chills Eyes: No visual changes. ENT: No sore throat. Cardiovascular: Denies chest  pain. Respiratory: Denies shortness of breath. Gastrointestinal: Positive epigastric and RUQ abdominal pain. Positive nausea and vomiting.  No diarrhea.  No constipation. Genitourinary: Negative for dysuria. Musculoskeletal: Negative for back pain. Skin: Negative for rash. Neurological: Negative for headaches, focal weakness or numbness.  10-point ROS otherwise negative.  ____________________________________________   PHYSICAL EXAM:  VITAL SIGNS: ED Triage Vitals  Enc Vitals Group     BP 09/23/18 1754 120/79     Pulse Rate 09/23/18 1754 81     Resp 09/23/18 1754 16     Temp 09/23/18 1754 99 F (37.2 C)     Temp Source 09/23/18 1754 Oral     SpO2 09/23/18 1754 100 %     Weight 09/23/18 1742 200 lb (90.7 kg)     Height 09/23/18 1742 5\' 3"  (1.6 m)     Pain Score 09/23/18 1742 8   Constitutional: Alert and oriented. Well appearing and in no acute distress. Eyes: Conjunctivae are normal. Head: Atraumatic. Nose: No congestion/rhinnorhea. Mouth/Throat: Mucous membranes are moist.  Neck: No stridor.   Cardiovascular: Normal rate, regular rhythm. Good peripheral circulation. Grossly normal heart sounds.   Respiratory: Normal respiratory effort.  No retractions. Lungs CTAB. Gastrointestinal: Soft with focal RUQ tenderness on exam. No rebound tenderness. No distention.  Musculoskeletal: No lower extremity tenderness nor edema. No gross deformities of extremities. Neurologic:  Normal speech and language. Skin:  Skin is warm, dry and intact. No rash noted.  ____________________________________________   LABS (all labs ordered are  listed, but only abnormal results are displayed)  Labs Reviewed  COMPREHENSIVE METABOLIC PANEL - Abnormal; Notable for the following components:      Result Value   AST 168 (*)    ALT 134 (*)    Alkaline Phosphatase 175 (*)    All other components within normal limits  CBC WITH DIFFERENTIAL/PLATELET - Abnormal; Notable for the following components:    WBC 12.8 (*)    Neutro Abs 9.5 (*)    All other components within normal limits  URINALYSIS, ROUTINE W REFLEX MICROSCOPIC - Abnormal; Notable for the following components:   Color, Urine STRAW (*)    Specific Gravity, Urine 1.003 (*)    All other components within normal limits  LIPASE, BLOOD  I-STAT BETA HCG BLOOD, ED ([facility], [facility], [facility] ONLY)   ____________________________________________  RADIOLOGY  Ct Abdomen Pelvis W Contrast  Result Date: 09/23/2018 CLINICAL DATA:  Right upper quadrant pain. Suspect cholecystitis. Three weeks postpartum EXAM: CT ABDOMEN AND PELVIS WITH CONTRAST TECHNIQUE: Multidetector CT imaging of the abdomen and pelvis was performed using the standard protocol following bolus administration of intravenous contrast. CONTRAST:  OMNIPAQUE IOHEXOL 300 MG/ML  SOLN COMPARISON:  None. FINDINGS: Lower chest: Lung bases clear. Hepatobiliary: Small gallstones without gallbladder wall thickening. No pericholecystic fluid or edema. No biliary dilatation. Normal liver. Pancreas: Negative Spleen: Negative Adrenals/Urinary Tract: Adrenal glands are unremarkable. Kidneys are normal, without renal calculi, focal lesion, or hydronephrosis. Bladder is unremarkable. Stomach/Bowel: Negative for bowel obstruction. Negative for bowel mass or edema. Normal appendix Vascular/Lymphatic: Negative Reproductive: Diffusely enlarged uterus due to recent delivery. No focal abnormality. No pelvic mass. Other: No free fluid Musculoskeletal: Negative IMPRESSION: Small gallstones without evidence of acute cholecystitis Normal appendix Electronically Signed   By: Marlan Palau M.D.   On: 09/23/2018 20:03    ____________________________________________   PROCEDURES  Procedure(s) performed:   Procedures   ____________________________________________   INITIAL IMPRESSION / ASSESSMENT AND PLAN / ED COURSE  Pertinent labs & imaging results that were available during my care of the patient were  reviewed by me and considered in my medical decision making (see chart for details).   Patient presents to the emergency department with epigastric and right upper quadrant abdominal pain 3 weeks status post NSVD which was uncomplicated.  Patient has tenderness in the right upper quadrant and positive Murphy sign on exam.  Suspicion is elevated for cholecystitis.  Atypical appendicitis presentation also considered.  Doubt ureteral stone.  Patient has no abdominal surgical history.  No ultrasound available at this hour.  Plan for CT imaging of the abdomen and pelvis along with screening lab work.  Patient to remain n.p.o.  She does not want any medication for pain or nausea at this time.  09:00 PM  CT imaging shows cholelithiasis without evidence of acute cholecystitis.  Lab work shows mild elevated LFTs.  I discussed this with Dr. Jena Gauss who can arrange to follow-up with the patient and trend her LFTs as an outpatient.  Agrees with plan for p.o. challenge here to help determine if this patient can be managed as an outpatient.  Discussed this with the patient.  She is tolerating fluids and states her pain is much improved.  She would prefer not to take anything strong for pain as she is looking after her infant.  She is not breast-feeding.  Discussed the plan with her including follow-up with both GI and general surgery as an outpatient.  She feels comfortable with this plan.  Discussed ED  return precautions in detail. ____________________________________________  FINAL CLINICAL IMPRESSION(S) / ED DIAGNOSES  Final diagnoses:  Symptomatic cholelithiasis  Elevated LFTs     MEDICATIONS GIVEN DURING THIS VISIT:  Medications  sodium chloride 0.9 % bolus 500 mL (0 mLs Intravenous Stopped 09/23/18 1910)  iohexol (OMNIPAQUE) 300 MG/ML solution 100 mL (100 mLs Intravenous Contrast Given 09/23/18 1916)     NEW OUTPATIENT MEDICATIONS STARTED DURING THIS VISIT:  New Prescriptions   DICYCLOMINE (BENTYL) 20  MG TABLET    Take 1 tablet (20 mg total) by mouth 3 (three) times daily as needed for spasms (abdominal cramping).   ONDANSETRON (ZOFRAN ODT) 4 MG DISINTEGRATING TABLET    Take 1 tablet (4 mg total) by mouth every 8 (eight) hours as needed for nausea or vomiting.    Note:  This document was prepared using Dragon voice recognition software and may include unintentional dictation errors.  Alona BeneJoshua Lilja Soland, MD Emergency Medicine    Shaunice Levitan, Arlyss RepressJoshua G, MD 09/23/18 2115

## 2018-09-23 NOTE — Discharge Instructions (Signed)
You were seen today with abdominal pain.  I suspect that gallstones are causing your pain.  You will need to follow-up with both the gastroenterologist and the general surgeon.  Your liver enzymes are slightly elevated and need to be followed up with repeat testing in the office.  The surgeon will likely discuss removing your gallbladder at some point.  Try to avoid fatty foods.  Return to the emergency department with any new or worsening pain.  Also return with any fever, chills, or other new/worsening symptoms.

## 2018-09-23 NOTE — Telephone Encounter (Signed)
Patient states she has started having severe upper right abdominal pain along with nausea and vomiting.  States she had some issues with her gallbladder during her pregnancy and is feeling like this is related to it again.  Says she has not had much energy to take care of her baby and is unsure if she needs to go to the ER.  Informed patient that she may need an u/s of her gallbladder and labs as well.  Advised if she felt like she needed to go to the ER then she should go.  Verbalized understanding.

## 2018-09-23 NOTE — ED Triage Notes (Signed)
Pt c/o upper, mid abdominal pain along with n/v that started last night and has been constant since. Pt reports she started having intermittent episodes of this for a few months. Pt is 3 weeks postpartum and called her OB today and they advised her to come to the ED. Pt's OB is [facility] here in [place]. Pt has a normal vaginal delivery.

## 2018-09-23 NOTE — ED Notes (Signed)
Pt given sprite 

## 2018-09-24 ENCOUNTER — Telehealth: Payer: Self-pay | Admitting: Internal Medicine

## 2018-09-24 NOTE — Telephone Encounter (Signed)
great

## 2018-09-24 NOTE — Telephone Encounter (Signed)
Patient will have a Facetime visit with Lewie Loron tomorrow at 2:30pm

## 2018-09-25 ENCOUNTER — Ambulatory Visit (INDEPENDENT_AMBULATORY_CARE_PROVIDER_SITE_OTHER): Payer: PRIVATE HEALTH INSURANCE | Admitting: Gastroenterology

## 2018-09-25 ENCOUNTER — Encounter: Payer: Self-pay | Admitting: Gastroenterology

## 2018-09-25 ENCOUNTER — Other Ambulatory Visit: Payer: Self-pay

## 2018-09-25 ENCOUNTER — Encounter: Payer: Self-pay | Admitting: *Deleted

## 2018-09-25 ENCOUNTER — Other Ambulatory Visit: Payer: Self-pay | Admitting: *Deleted

## 2018-09-25 DIAGNOSIS — K8020 Calculus of gallbladder without cholecystitis without obstruction: Secondary | ICD-10-CM

## 2018-09-25 DIAGNOSIS — R1011 Right upper quadrant pain: Secondary | ICD-10-CM | POA: Diagnosis not present

## 2018-09-25 DIAGNOSIS — R7989 Other specified abnormal findings of blood chemistry: Secondary | ICD-10-CM

## 2018-09-25 DIAGNOSIS — R945 Abnormal results of liver function studies: Secondary | ICD-10-CM | POA: Diagnosis not present

## 2018-09-25 NOTE — Progress Notes (Signed)
Referring Provider: [facility] ED Primary Care Physician:  Patient, No Pcp Per  Primary GI: Dr. Gala Romney   Virtual Visit via Telephone Note Due to COVID-19, visit is conducted virtually and was requested by patient.   I connected with Miranda Campbell on 09/25/18 at  2:30 PM EDT by telephone and verified that I am speaking with the correct person using two identifiers.   I discussed the limitations, risks, security and privacy concerns of performing an evaluation and management service by telephone and the availability of in person appointments. I also discussed with the patient that there may be a patient responsible charge related to this service. The patient expressed understanding and agreed to proceed.  Chief Complaint  Patient presents with  . Abdominal Pain    seen in ER recently but has not had any abd pain since. Denies nausea/vomiting.     History of Present Illness: 23 year old female 3 weeks post-partum, presented to ED 09/23/2018 with symptomatic cholelithiasis. CT abd/pelvis with small gallstones without evidence of acute cholecystitis. AST 168, ALT 134, Alk Phos 175.   Had a baby girl, first child. Patient is sleeping well. Baby wakes twice a nice. Not breastfeeding. No longer working outside the home and will be staying home with baby.   Started having pain at [redacted] weeks pregnant. Thought was bad heartburn. Occurred 4 times during pregnancy. Pain in RUQ and radiate up to mid chest. Usually occurs 2 hours postprandially. Thinks she had eaten some type of pasta prior to most recent episode. No fever/chills. No pain currently. One episode of vomiting but now resolved. Referred to [facility]. Doing well today.   Past Medical History:  Diagnosis Date  . Medical history non-contributory      Past Surgical History:  Procedure Laterality Date  . NO PAST SURGERIES       Current Meds  Medication Sig  . dicyclomine (BENTYL) 20 MG tablet Take 1 tablet  (20 mg total) by mouth 3 (three) times daily as needed for spasms (abdominal cramping).  . ondansetron (ZOFRAN ODT) 4 MG disintegrating tablet Take 1 tablet (4 mg total) by mouth every 8 (eight) hours as needed for nausea or vomiting.  . Prenatal Vit-DSS-Fe Cbn-FA (PRENATAL AD PO) Take 1 tablet by mouth every morning.       Review of Systems: Gen: Denies fever, chills, anorexia. Denies fatigue, weakness, weight loss.  CV: Denies chest pain, palpitations, syncope, peripheral edema, and claudication. Resp: Denies dyspnea at rest, cough, wheezing, coughing up blood, and pleurisy. GI: see HPI Derm: Denies rash, itching, dry skin Psych: Denies depression, anxiety, memory loss, confusion. No homicidal or suicidal ideation.  Heme: Denies bruising, bleeding, and enlarged lymph nodes.  Observations/Objective: No distress. Alert and cooperative.   Assessment and Plan: 23 year old female with episodic RUQ pain postprandially throughout pregnancy starting at 20 weeks, with recent presentation to the ED at 3 weeks post-partum with RUQ pain. CT scan with cholelithiasis, normal liver and pancreas. [place] not completed. CBD normal. Elevated transaminases and mildly elevated alk phos with normal bilirubin. Mild leukocytosis, normal platelets..No evidence for pancreatitis. Currently asymptomatic. We will recheck HFP now to ensure improvement and help facilitate referral to [place] for elective cholecystectomy. Follow a low fat/bland diet for now.   Follow Up Instructions: Please complete the blood work so we can ensure it is stable/improving.  We will help facilitate surgical referral to remove your gallbladder.  Make sure you are following a low-fat, bland diet for  now.   I discussed the assessment and treatment plan with the patient. The patient was provided an opportunity to ask questions and all were answered. The patient agreed with the plan and demonstrated an understanding of the  instructions.   The patient was advised to call back or seek an in-person evaluation if the symptoms worsen or if the condition fails to improve as anticipated.  I provided 15 minutes of face-to-face time during this encounter via video call.   Annitta Needs, PhD, ANP-BC [facility]

## 2018-09-25 NOTE — Patient Instructions (Signed)
Please complete the blood work so we can ensure it is stable/improving.  We will help facilitate surgical referral to remove your gallbladder.  Make sure you are following a low-fat, bland diet for now.  Congratulations on your new baby!  It was a pleasure to see you today. I strive to create trusting relationships with patients to provide genuine, compassionate, and quality care. I value your feedback. If you receive a survey regarding your visit,  I greatly appreciate you taking time to fill this out.   Gelene Mink, PhD, ANP-BC [facility]

## 2018-09-26 ENCOUNTER — Encounter: Payer: Self-pay | Admitting: Adult Health

## 2018-09-26 ENCOUNTER — Ambulatory Visit (INDEPENDENT_AMBULATORY_CARE_PROVIDER_SITE_OTHER): Payer: PRIVATE HEALTH INSURANCE | Admitting: Adult Health

## 2018-09-26 ENCOUNTER — Other Ambulatory Visit: Payer: Self-pay

## 2018-09-26 DIAGNOSIS — Z1331 Encounter for screening for depression: Secondary | ICD-10-CM | POA: Insufficient documentation

## 2018-09-26 NOTE — Progress Notes (Addendum)
Patient ID: Miranda Campbell, female   DOB: April 04, 1996, 23 y.o.   MRN: [id number]    TELEHEALTH VIRTUAL POSTPARTUM VISIT ENCOUNTER NOTE  I connected with Irving BurtonEmily on 09/26/18 at  2:00 PM EDT by telephone at home and verified that I am speaking with the correct person using two identifiers.   I discussed the limitations, risks, security and privacy concerns of performing an evaluation and management service by telephone and the availability of in person appointments. I also discussed with the patient that there may be a patient responsible charge related to this service. The patient expressed understanding and agreed to proceed.  Appointment Date: 09/26/2018  [facility]  Chief Complaint:  Chief Complaint  Patient presents with  . postpartum visit    History of Present Illness: Miranda Paganmily Lillibridge is a 23 y.o. Caucasian female,married,G1P1001, seen for the above chief complaint.   She is s/p normal spontaneous vaginal delivery on 09/02/2018 at 41+4 weeks, a baby girl, Gerarda Guntherllie, who was delivered by Cleone Slimaroline Neill, CNM, and  she was discharged to home on 09/04/2018. Pregnancy complications none. Baby is doing well. She did have visit with Lewie LoronAnna Boone, yesterday and has gallstones, has appointment next week with Dr Lovell SheehanJenkins.   Complaints are none: Vaginal bleeding or discharge: No  Mode of feeding infant: Bottle Intercourse: No  Contraception: condoms PP depression s/s: No . EDPS score 1. Any bowel or bladder issues: No  Pap smear: no results on file, will be due in 2021, she says    Review of Systems:  Her 12 point review of systems is negative or as noted in the History of Present Illness.  Patient Active Problem List   Diagnosis Date Noted  . Elevated LFTs 09/25/2018  . RUQ pain 09/25/2018  . SVD (spontaneous vaginal delivery) 09/02/2018  . Obstetric vaginal laceration with first degree perineal laceration 09/02/2018  . Pregnancy with 41 completed weeks gestation 09/01/2018  .  Supervision of normal first pregnancy 01/28/2018  . Family history of fragile X syndrome 01/28/2018  . Anxiety 01/28/2018    Medications Yulisa Reyez had no medications administered during this visit. Current Outpatient Medications  Medication Sig Dispense Refill  . dicyclomine (BENTYL) 20 MG tablet Take 1 tablet (20 mg total) by mouth 3 (three) times daily as needed for spasms (abdominal cramping). 20 tablet 0  . ondansetron (ZOFRAN ODT) 4 MG disintegrating tablet Take 1 tablet (4 mg total) by mouth every 8 (eight) hours as needed for nausea or vomiting. 20 tablet 0  . Prenatal Vit-DSS-Fe Cbn-FA (PRENATAL AD PO) Take 1 tablet by mouth every morning.      No current facility-administered medications for this visit.     Allergies Patient has no known allergies.  Physical Exam:  General:  Alert, oriented and cooperative.   Mental Status: Normal mood and affect perceived. Normal judgment and thought content.  Rest of physical exam deferred due to type of encounter Ht 5\' 3"  (1.6 m)   Wt 206 lb (93.4 kg)   Breastfeeding No   BMI 36.49 kg/m per pt  PP Depression Screening:   Edinburgh Postnatal Depression Scale - 09/26/18 1447      Edinburgh Postnatal Depression Scale:  In the Past 7 Days   I have been able to laugh and see the funny side of things.  0    I have looked forward with enjoyment to things.  0    I have blamed myself unnecessarily when things went wrong.  1  I have been anxious or worried for no good reason.  0    I have felt scared or panicky for no good reason.  0    Things have been getting on top of me.  0    I have been so unhappy that I have had difficulty sleeping.  0    I have felt sad or miserable.  0    I have been so unhappy that I have been crying.  0    The thought of harming myself has occurred to me.  0    Edinburgh Postnatal Depression Scale Total  1       Assessment:Patient is a 23 y.o. G1P1001 who is 3 1/2 weeks postpartum from a normal  spontaneous vaginal delivery.  She is doing well.   1. Postpartum care following vaginal delivery   2. Depression screening     Plan: Use condoms Return ion about 9 months for pap and physical.     I discussed the assessment and treatment plan with the patient. The patient was provided an opportunity to ask questions and all were answered. The patient agreed with the plan and demonstrated an understanding of the instructions.   The patient was advised to call back or seek an in-person evaluation/go to the ED for any concerning postpartum symptoms.  I provided 8 minutes of non-face-to-face time during this encounter.   Cyril Mourning, NP [facility] [employer], [facility]

## 2018-09-26 NOTE — Progress Notes (Signed)
No pcp per patient 

## 2018-10-01 ENCOUNTER — Other Ambulatory Visit: Payer: Self-pay

## 2018-10-01 ENCOUNTER — Encounter: Payer: Self-pay | Admitting: General Surgery

## 2018-10-01 ENCOUNTER — Ambulatory Visit (INDEPENDENT_AMBULATORY_CARE_PROVIDER_SITE_OTHER): Payer: PRIVATE HEALTH INSURANCE | Admitting: General Surgery

## 2018-10-01 VITALS — BP 113/80 | HR 79 | Temp 98.5°F | Resp 16 | Wt 208.0 lb

## 2018-10-01 DIAGNOSIS — K8000 Calculus of gallbladder with acute cholecystitis without obstruction: Secondary | ICD-10-CM | POA: Diagnosis not present

## 2018-10-01 NOTE — Progress Notes (Signed)
Miranda Campbell; [id number]; 10/07/1995   HPI Patient is a 23-year-old white female who was referred to my care by Anna Boone of gastroenterology for evaluation treatment of biliary colic secondary to cholelithiasis.  Patient had a known history of cholelithiasis during her pregnancy.  She vaginally delivered her baby 1 month ago.  She has had recurrent episodes of right upper quadrant abdominal pain radiating to the right flank, nausea, and vomiting intermittently for the past few weeks.  She was seen in the emergency room recently which confirmed the diagnosis of acute cholecystitis with cholelithiasis.  Patient currently has 0 out of 10 abdominal pain.  She denies any fever, chills, or jaundice.  Patient is not breast-feeding. Past Medical History:  Diagnosis Date  . Medical history non-contributory     Past Surgical History:  Procedure Laterality Date  . NO PAST SURGERIES      Family History  Problem Relation Age of Onset  . Spina bifida Mother   . Colon cancer Neg Hx   . Colon polyps Neg Hx     Current Outpatient Medications on File Prior to Visit  Medication Sig Dispense Refill  . dicyclomine (BENTYL) 20 MG tablet Take 1 tablet (20 mg total) by mouth 3 (three) times daily as needed for spasms (abdominal cramping). 20 tablet 0  . Prenatal Vit-DSS-Fe Cbn-FA (PRENATAL AD PO) Take 1 tablet by mouth every morning.     . ondansetron (ZOFRAN ODT) 4 MG disintegrating tablet Take 1 tablet (4 mg total) by mouth every 8 (eight) hours as needed for nausea or vomiting. (Patient not taking: Reported on 10/01/2018) 20 tablet 0   No current facility-administered medications on file prior to visit.     No Known Allergies  Social History   Substance and Sexual Activity  Alcohol Use Yes   Comment: occasionally     Social History   Tobacco Use  Smoking Status Never Smoker  Smokeless Tobacco Never Used    Review of Systems  Constitutional: Negative.   HENT: Negative.   Eyes: Negative.    Respiratory: Negative.   Cardiovascular: Negative.   Gastrointestinal: Positive for abdominal pain and nausea.  Genitourinary: Negative.   Musculoskeletal: Negative.   Skin: Negative.   Neurological: Negative.   Endo/Heme/Allergies: Negative.   Psychiatric/Behavioral: Negative.     Objective   Vitals:   10/01/18 1010  BP: 113/80  Pulse: 79  Resp: 16  Temp: 98.5 F (36.9 C)  SpO2: 99%    Physical Exam Vitals signs reviewed.  Constitutional:      Appearance: Normal appearance. She is not ill-appearing.  HENT:     Head: Normocephalic and atraumatic.  Eyes:     General: No scleral icterus. Cardiovascular:     Rate and Rhythm: Normal rate and regular rhythm.     Heart sounds: Normal heart sounds. No murmur. No friction rub. No gallop.   Pulmonary:     Effort: Pulmonary effort is normal. No respiratory distress.     Breath sounds: Normal breath sounds. No stridor. No wheezing, rhonchi or rales.  Abdominal:     General: Abdomen is flat. Bowel sounds are normal. There is no distension.     Palpations: Abdomen is soft. There is no mass.     Tenderness: There is abdominal tenderness. There is no guarding or rebound.     Hernia: No hernia is present.     Comments: Slight discomfort to deep palpation in the right upper quadrant.  Skin:    General: Skin is   warm and dry.  Neurological:     Mental Status: She is alert and oriented to person, place, and time.    Recent ER notes reviewed Assessment  Cholecystitis, cholelithiasis Plan   Patient is scheduled for laparoscopic cholecystectomy on 10/09/2018.  The risks and benefits of the procedure including bleeding, infection, hepatobiliary injury, and the possibility of an open procedure were fully explained to the patient, who gave informed consent.  Due to COVID-19 protocol, patient will not tolerate a 4-week delay in surgical intervention.  

## 2018-10-01 NOTE — H&P (Signed)
Miranda Campbell; [id number]; [birth year]   HPI Patient is a 23 year old white female who was referred to my care by Lewie LoronAnna Boone of gastroenterology for evaluation treatment of biliary colic secondary to cholelithiasis.  Patient had a known history of cholelithiasis during her pregnancy.  She vaginally delivered her baby 1 month ago.  She has had recurrent episodes of right upper quadrant abdominal pain radiating to the right flank, nausea, and vomiting intermittently for the past few weeks.  She was seen in the emergency room recently which confirmed the diagnosis of acute cholecystitis with cholelithiasis.  Patient currently has 0 out of 10 abdominal pain.  She denies any fever, chills, or jaundice.  Patient is not breast-feeding. Past Medical History:  Diagnosis Date  . Medical history non-contributory     Past Surgical History:  Procedure Laterality Date  . NO PAST SURGERIES      Family History  Problem Relation Age of Onset  . Spina bifida Mother   . Colon cancer Neg Hx   . Colon polyps Neg Hx     Current Outpatient Medications on File Prior to Visit  Medication Sig Dispense Refill  . dicyclomine (BENTYL) 20 MG tablet Take 1 tablet (20 mg total) by mouth 3 (three) times daily as needed for spasms (abdominal cramping). 20 tablet 0  . Prenatal Vit-DSS-Fe Cbn-FA (PRENATAL AD PO) Take 1 tablet by mouth every morning.     . ondansetron (ZOFRAN ODT) 4 MG disintegrating tablet Take 1 tablet (4 mg total) by mouth every 8 (eight) hours as needed for nausea or vomiting. (Patient not taking: Reported on 10/01/2018) 20 tablet 0   No current facility-administered medications on file prior to visit.     No Known Allergies  Social History   Substance and Sexual Activity  Alcohol Use Yes   Comment: occasionally     Social History   Tobacco Use  Smoking Status Never Smoker  Smokeless Tobacco Never Used    Review of Systems  Constitutional: Negative.   HENT: Negative.   Eyes: Negative.    Respiratory: Negative.   Cardiovascular: Negative.   Gastrointestinal: Positive for abdominal pain and nausea.  Genitourinary: Negative.   Musculoskeletal: Negative.   Skin: Negative.   Neurological: Negative.   Endo/Heme/Allergies: Negative.   Psychiatric/Behavioral: Negative.     Objective   Vitals:   10/01/18 1010  BP: 113/80  Pulse: 79  Resp: 16  Temp: 98.5 F (36.9 C)  SpO2: 99%    Physical Exam Vitals signs reviewed.  Constitutional:      Appearance: Normal appearance. She is not ill-appearing.  HENT:     Head: Normocephalic and atraumatic.  Eyes:     General: No scleral icterus. Cardiovascular:     Rate and Rhythm: Normal rate and regular rhythm.     Heart sounds: Normal heart sounds. No murmur. No friction rub. No gallop.   Pulmonary:     Effort: Pulmonary effort is normal. No respiratory distress.     Breath sounds: Normal breath sounds. No stridor. No wheezing, rhonchi or rales.  Abdominal:     General: Abdomen is flat. Bowel sounds are normal. There is no distension.     Palpations: Abdomen is soft. There is no mass.     Tenderness: There is abdominal tenderness. There is no guarding or rebound.     Hernia: No hernia is present.     Comments: Slight discomfort to deep palpation in the right upper quadrant.  Skin:    General: Skin is  warm and dry.  Neurological:     Mental Status: She is alert and oriented to person, place, and time.    Recent ER notes reviewed Assessment  Cholecystitis, cholelithiasis Plan   Patient is scheduled for laparoscopic cholecystectomy on 10/09/2018.  The risks and benefits of the procedure including bleeding, infection, hepatobiliary injury, and the possibility of an open procedure were fully explained to the patient, who gave informed consent.  Due to COVID-19 protocol, patient will not tolerate a 4-week delay in surgical intervention.

## 2018-10-01 NOTE — Patient Instructions (Signed)
Laparoscopic Cholecystectomy Laparoscopic cholecystectomy is surgery to remove the gallbladder. The gallbladder is a pear-shaped organ that lies beneath the liver on the right side of the body. The gallbladder stores bile, which is a fluid that helps the body to digest fats. Cholecystectomy is often done for inflammation of the gallbladder (cholecystitis). This condition is usually caused by a buildup of gallstones (cholelithiasis) in the gallbladder. Gallstones can block the flow of bile, which can result in inflammation and pain. In severe cases, emergency surgery may be required. This procedure is done though small incisions in your abdomen (laparoscopic surgery). A thin scope with a camera (laparoscope) is inserted through one incision. Thin surgical instruments are inserted through the other incisions. In some cases, a laparoscopic procedure may be turned into a type of surgery that is done through a larger incision (open surgery). Tell a health care provider about:  Any allergies you have.  All medicines you are taking, including vitamins, herbs, eye drops, creams, and over-the-counter medicines.  Any problems you or family members have had with anesthetic medicines.  Any blood disorders you have.  Any surgeries you have had.  Any medical conditions you have.  Whether you are pregnant or may be pregnant. What are the risks? Generally, this is a safe procedure. However, problems may occur, including:  Infection.  Bleeding.  Allergic reactions to medicines.  Damage to other structures or organs.  A stone remaining in the common bile duct. The common bile duct carries bile from the gallbladder into the small intestine.  A bile leak from the cyst duct that is clipped when your gallbladder is removed. What happens before the procedure?   Medicines  Ask your health care provider about: ? Changing or stopping your regular medicines. This is especially important if you are taking  diabetes medicines or blood thinners. ? Taking medicines such as aspirin and ibuprofen. These medicines can thin your blood. Do not take these medicines before your procedure if your health care provider instructs you not to.  You may be given antibiotic medicine to help prevent infection. General instructions  Let your health care provider know if you develop a cold or an infection before surgery.  Plan to have someone take you home from the hospital or clinic.  Ask your health care provider how your surgical site will be marked or identified. What happens during the procedure?   To reduce your risk of infection: ? Your health care team will wash or sanitize their hands. ? Your skin will be washed with soap. ? Hair may be removed from the surgical area.  An IV tube may be inserted into one of your veins.  You will be given one or more of the following: ? A medicine to help you relax (sedative). ? A medicine to make you fall asleep (general anesthetic).  A breathing tube will be placed in your mouth.  Your surgeon will make several small cuts (incisions) in your abdomen.  The laparoscope will be inserted through one of the small incisions. The camera on the laparoscope will send images to a TV screen (monitor) in the operating room. This lets your surgeon see inside your abdomen.  Air-like gas will be pumped into your abdomen. This will expand your abdomen to give the surgeon more room to perform the surgery.  Other tools that are needed for the procedure will be inserted through the other incisions. The gallbladder will be removed through one of the incisions.  Your common bile duct   may be examined. If stones are found in the common bile duct, they may be removed.  After your gallbladder has been removed, the incisions will be closed with stitches (sutures), staples, or skin glue.  Your incisions may be covered with a bandage (dressing). The procedure may vary among health  care providers and hospitals. What happens after the procedure?  Your blood pressure, heart rate, breathing rate, and blood oxygen level will be monitored until the medicines you were given have worn off.  You will be given medicines as needed to control your pain.  Do not drive for 24 hours if you were given a sedative. This information is not intended to replace advice given to you by your health care provider. Make sure you discuss any questions you have with your health care provider. Document Released: 05/22/2005 Document Revised: 04/19/2017 Document Reviewed: 11/08/2015 Elsevier Interactive Patient Education  2019 Elsevier Inc.  

## 2018-10-03 ENCOUNTER — Encounter (HOSPITAL_COMMUNITY): Payer: Self-pay

## 2018-10-03 ENCOUNTER — Other Ambulatory Visit: Payer: Self-pay

## 2018-10-04 ENCOUNTER — Encounter (HOSPITAL_COMMUNITY)
Admission: RE | Admit: 2018-10-04 | Discharge: 2018-10-04 | Disposition: A | Discharge status: Home / Self Care | Payer: PRIVATE HEALTH INSURANCE | Source: Ambulatory Visit | Attending: General Surgery | Admitting: General Surgery

## 2018-10-09 ENCOUNTER — Ambulatory Visit (HOSPITAL_COMMUNITY): Payer: PRIVATE HEALTH INSURANCE | Admitting: Anesthesiology

## 2018-10-09 ENCOUNTER — Ambulatory Visit (HOSPITAL_COMMUNITY)
Admission: RE | Admit: 2018-10-09 | Discharge: 2018-10-09 | Disposition: A | Discharge status: Home / Self Care | Payer: PRIVATE HEALTH INSURANCE | Attending: General Surgery | Admitting: General Surgery

## 2018-10-09 ENCOUNTER — Encounter (HOSPITAL_COMMUNITY): Payer: Self-pay | Admitting: *Deleted

## 2018-10-09 ENCOUNTER — Encounter (HOSPITAL_COMMUNITY)
Admission: RE | Disposition: A | Discharge status: Home / Self Care | Payer: Self-pay | Source: Home / Self Care | Attending: General Surgery

## 2018-10-09 ENCOUNTER — Other Ambulatory Visit: Payer: Self-pay

## 2018-10-09 DIAGNOSIS — K8010 Calculus of gallbladder with chronic cholecystitis without obstruction: Secondary | ICD-10-CM | POA: Diagnosis not present

## 2018-10-09 DIAGNOSIS — K8000 Calculus of gallbladder with acute cholecystitis without obstruction: Secondary | ICD-10-CM | POA: Diagnosis present

## 2018-10-09 HISTORY — PX: CHOLECYSTECTOMY: SHX55

## 2018-10-09 LAB — PREGNANCY, URINE: Preg Test, Ur: NEGATIVE

## 2018-10-09 SURGERY — LAPAROSCOPIC CHOLECYSTECTOMY
Anesthesia: General

## 2018-10-09 MED ORDER — SUGAMMADEX SODIUM 200 MG/2ML IV SOLN
INTRAVENOUS | Status: AC
  Filled 2018-10-09: qty 2

## 2018-10-09 MED ORDER — ONDANSETRON 4 MG PO TBDP
4.0000 mg | ORAL_TABLET | Freq: Once | ORAL | Status: AC
  Administered 2018-10-09: 4 mg via ORAL

## 2018-10-09 MED ORDER — ROCURONIUM BROMIDE 10 MG/ML (PF) SYRINGE
PREFILLED_SYRINGE | INTRAVENOUS | Status: AC
  Filled 2018-10-09: qty 10

## 2018-10-09 MED ORDER — SUGAMMADEX SODIUM 200 MG/2ML IV SOLN
INTRAVENOUS | Status: DC | PRN
  Administered 2018-10-09: 100 mg via INTRAVENOUS
  Administered 2018-10-09: 200 mg via INTRAVENOUS

## 2018-10-09 MED ORDER — ROCURONIUM BROMIDE 100 MG/10ML IV SOLN
INTRAVENOUS | Status: DC | PRN
  Administered 2018-10-09: 5 mg via INTRAVENOUS
  Administered 2018-10-09: 25 mg via INTRAVENOUS

## 2018-10-09 MED ORDER — SODIUM CHLORIDE 0.9 % IR SOLN
Status: DC | PRN
  Administered 2018-10-09: 1000 mL

## 2018-10-09 MED ORDER — POVIDONE-IODINE 10 % EX OINT
TOPICAL_OINTMENT | CUTANEOUS | Status: AC
  Filled 2018-10-09: qty 1

## 2018-10-09 MED ORDER — MIDAZOLAM HCL 2 MG/2ML IJ SOLN: 0.5000 mg | Freq: Once | INTRAMUSCULAR | Status: DC | PRN

## 2018-10-09 MED ORDER — PROPOFOL 10 MG/ML IV BOLUS
INTRAVENOUS | Status: AC
  Filled 2018-10-09: qty 40

## 2018-10-09 MED ORDER — LIDOCAINE HCL 1 % IJ SOLN
INTRAMUSCULAR | Status: DC | PRN
  Administered 2018-10-09: 50 mg via INTRADERMAL

## 2018-10-09 MED ORDER — SUCCINYLCHOLINE CHLORIDE 200 MG/10ML IV SOSY
PREFILLED_SYRINGE | INTRAVENOUS | Status: AC
  Filled 2018-10-09: qty 10

## 2018-10-09 MED ORDER — CHLORHEXIDINE GLUCONATE CLOTH 2 % EX PADS: 6.0000 | MEDICATED_PAD | Freq: Once | CUTANEOUS | Status: DC

## 2018-10-09 MED ORDER — PROPOFOL 10 MG/ML IV BOLUS
INTRAVENOUS | Status: DC | PRN
  Administered 2018-10-09: 150 mg via INTRAVENOUS

## 2018-10-09 MED ORDER — HYDROCODONE-ACETAMINOPHEN 5-325 MG PO TABS: 1.0000 | ORAL_TABLET | ORAL | 0 refills | Status: DC | PRN

## 2018-10-09 MED ORDER — HYDROMORPHONE HCL 1 MG/ML IJ SOLN: 0.2500 mg | INTRAMUSCULAR | Status: DC | PRN

## 2018-10-09 MED ORDER — BUPIVACAINE LIPOSOME 1.3 % IJ SUSP
INTRAMUSCULAR | Status: AC
  Filled 2018-10-09: qty 20

## 2018-10-09 MED ORDER — HEMOSTATIC AGENTS (NO CHARGE) OPTIME
TOPICAL | Status: DC | PRN
  Administered 2018-10-09: 1 via TOPICAL

## 2018-10-09 MED ORDER — MIDAZOLAM HCL 2 MG/2ML IJ SOLN
INTRAMUSCULAR | Status: AC
  Filled 2018-10-09: qty 2

## 2018-10-09 MED ORDER — ONDANSETRON HCL 4 MG/2ML IJ SOLN
INTRAMUSCULAR | Status: AC
  Filled 2018-10-09: qty 2

## 2018-10-09 MED ORDER — KETOROLAC TROMETHAMINE 30 MG/ML IJ SOLN: 30.0000 mg | Freq: Once | INTRAMUSCULAR | Status: DC

## 2018-10-09 MED ORDER — FENTANYL CITRATE (PF) 250 MCG/5ML IJ SOLN
INTRAMUSCULAR | Status: AC
  Filled 2018-10-09: qty 5

## 2018-10-09 MED ORDER — PROMETHAZINE HCL 25 MG/ML IJ SOLN: 6.2500 mg | INTRAMUSCULAR | Status: DC | PRN

## 2018-10-09 MED ORDER — LACTATED RINGERS IV SOLN
INTRAVENOUS | Status: DC
  Administered 2018-10-09: 07:00:00 1000 mL via INTRAVENOUS

## 2018-10-09 MED ORDER — FENTANYL CITRATE (PF) 100 MCG/2ML IJ SOLN
INTRAMUSCULAR | Status: DC | PRN
  Administered 2018-10-09 (×2): 100 ug via INTRAVENOUS
  Administered 2018-10-09: 50 ug via INTRAVENOUS

## 2018-10-09 MED ORDER — SUCCINYLCHOLINE CHLORIDE 20 MG/ML IJ SOLN
INTRAMUSCULAR | Status: DC | PRN
  Administered 2018-10-09: 160 mg via INTRAVENOUS

## 2018-10-09 MED ORDER — HYDROCODONE-ACETAMINOPHEN 7.5-325 MG PO TABS: 1.0000 | ORAL_TABLET | Freq: Once | ORAL | Status: DC | PRN

## 2018-10-09 MED ORDER — ONDANSETRON HCL 4 MG/2ML IJ SOLN
INTRAMUSCULAR | Status: DC | PRN
  Administered 2018-10-09: 4 mg via INTRAVENOUS

## 2018-10-09 MED ORDER — MIDAZOLAM HCL 5 MG/5ML IJ SOLN
INTRAMUSCULAR | Status: DC | PRN
  Administered 2018-10-09: 2 mg via INTRAVENOUS

## 2018-10-09 MED ORDER — BUPIVACAINE LIPOSOME 1.3 % IJ SUSP
INTRAMUSCULAR | Status: DC | PRN
  Administered 2018-10-09: 20 mL

## 2018-10-09 MED ORDER — ONDANSETRON 4 MG PO TBDP
ORAL_TABLET | ORAL | Status: AC
  Filled 2018-10-09: qty 1

## 2018-10-09 MED ORDER — CIPROFLOXACIN IN D5W 400 MG/200ML IV SOLN
400.0000 mg | INTRAVENOUS | Status: AC
  Administered 2018-10-09: 400 mg via INTRAVENOUS

## 2018-10-09 MED ORDER — CIPROFLOXACIN IN D5W 400 MG/200ML IV SOLN
INTRAVENOUS | Status: AC
  Filled 2018-10-09: qty 200

## 2018-10-09 SURGICAL SUPPLY — 51 items
APPLIER CLIP ROT 10 11.4 M/L (STAPLE) ×3
BAG RETRIEVAL 10 (BASKET) ×1
BAG RETRIEVAL 10MM (BASKET) ×1
CHLORAPREP W/TINT 26 (MISCELLANEOUS) ×3 IMPLANT
CLIP APPLIE ROT 10 11.4 M/L (STAPLE) ×1 IMPLANT
CLOTH BEACON ORANGE TIMEOUT ST (SAFETY) ×3 IMPLANT
COVER LIGHT HANDLE STERIS (MISCELLANEOUS) ×6 IMPLANT
COVER WAND RF STERILE (DRAPES) ×3 IMPLANT
DERMABOND ADVANCED (GAUZE/BANDAGES/DRESSINGS) ×2
DERMABOND ADVANCED .7 DNX12 (GAUZE/BANDAGES/DRESSINGS) ×1 IMPLANT
ELECT REM PT RETURN 9FT ADLT (ELECTROSURGICAL) ×3
ELECTRODE REM PT RTRN 9FT ADLT (ELECTROSURGICAL) ×1 IMPLANT
FILTER SMOKE EVAC LAPAROSHD (FILTER) ×3 IMPLANT
GLOVE BIO SURGEON STRL SZ 6.5 (GLOVE) ×2 IMPLANT
GLOVE BIO SURGEONS STRL SZ 6.5 (GLOVE) ×1
GLOVE BIOGEL PI IND STRL 6.5 (GLOVE) ×1 IMPLANT
GLOVE BIOGEL PI IND STRL 7.0 (GLOVE) ×1 IMPLANT
GLOVE BIOGEL PI INDICATOR 6.5 (GLOVE) ×2
GLOVE BIOGEL PI INDICATOR 7.0 (GLOVE) ×2
GLOVE ECLIPSE 6.5 STRL STRAW (GLOVE) ×6 IMPLANT
GLOVE SURG SS PI 7.5 STRL IVOR (GLOVE) ×3 IMPLANT
GOWN STRL REUS W/TWL LRG LVL3 (GOWN DISPOSABLE) ×9 IMPLANT
HEMOSTAT SNOW SURGICEL 2X4 (HEMOSTASIS) ×3 IMPLANT
INST SET LAPROSCOPIC AP (KITS) ×3 IMPLANT
KIT TURNOVER KIT A (KITS) ×3 IMPLANT
MANIFOLD NEPTUNE II (INSTRUMENTS) ×3 IMPLANT
NEEDLE HYPO 18GX1.5 BLUNT FILL (NEEDLE) ×3 IMPLANT
NEEDLE HYPO 22GX1.5 SAFETY (NEEDLE) ×3 IMPLANT
NEEDLE INSUFFLATION 14GA 120MM (NEEDLE) ×3 IMPLANT
NS IRRIG 1000ML POUR BTL (IV SOLUTION) ×3 IMPLANT
PACK LAP CHOLE LZT030E (CUSTOM PROCEDURE TRAY) ×3 IMPLANT
PAD ARMBOARD 7.5X6 YLW CONV (MISCELLANEOUS) ×3 IMPLANT
SET BASIN LINEN APH (SET/KITS/TRAYS/PACK) ×3 IMPLANT
SLEEVE ENDOPATH XCEL 5M (ENDOMECHANICALS) ×3 IMPLANT
SPONGE GAUZE 2X2 8PLY STER LF (GAUZE/BANDAGES/DRESSINGS) ×4
SPONGE GAUZE 2X2 8PLY STRL LF (GAUZE/BANDAGES/DRESSINGS) ×8 IMPLANT
STAPLER VISISTAT (STAPLE) ×3 IMPLANT
SUT MNCRL AB 4-0 PS2 18 (SUTURE) ×6 IMPLANT
SUT VICRYL 0 UR6 27IN ABS (SUTURE) ×3 IMPLANT
SYR 20CC LL (SYRINGE) ×3 IMPLANT
SYR 30ML LL (SYRINGE) ×3 IMPLANT
SYS BAG RETRIEVAL 10MM (BASKET) ×1
SYSTEM BAG RETRIEVAL 10MM (BASKET) ×1 IMPLANT
TAPE CLOTH SURG 4X10 WHT LF (GAUZE/BANDAGES/DRESSINGS) ×3 IMPLANT
TROCAR ENDO BLADELESS 11MM (ENDOMECHANICALS) ×3 IMPLANT
TROCAR XCEL NON-BLD 5MMX100MML (ENDOMECHANICALS) ×3 IMPLANT
TROCAR XCEL UNIV SLVE 11M 100M (ENDOMECHANICALS) ×3 IMPLANT
TUBE CONNECTING 12'X1/4 (SUCTIONS) ×1
TUBE CONNECTING 12X1/4 (SUCTIONS) ×2 IMPLANT
TUBING INSUFFLATION (TUBING) ×3 IMPLANT
WARMER LAPAROSCOPE (MISCELLANEOUS) ×3 IMPLANT

## 2018-10-09 NOTE — Interval H&P Note (Signed)
History and Physical Interval Note:  10/09/2018 7:20 AM  Miranda Campbell  has presented today for surgery, with the diagnosis of cholelithiasis.  The various methods of treatment have been discussed with the patient and family. After consideration of risks, benefits and other options for treatment, the patient has consented to  Procedure(s): LAPAROSCOPIC CHOLECYSTECTOMY (N/A) as a surgical intervention.  The patient's history has been reviewed, patient examined, no change in status, stable for surgery.  I have reviewed the patient's chart and labs.  Questions were answered to the patient's satisfaction.     Franky Macho

## 2018-10-09 NOTE — Anesthesia Preprocedure Evaluation (Signed)
Anesthesia Evaluation  Patient identified by MRN, date of birth, ID band Patient awake    Reviewed: Allergy & Precautions, NPO status , Patient's Chart, lab work & pertinent test results  Airway Mallampati: II  TM Distance: >3 FB Neck ROM: Full    Dental no notable dental hx. (+) Teeth Intact, Poor Dentition   Pulmonary neg pulmonary ROS,    Pulmonary exam normal breath sounds clear to auscultation       Cardiovascular Exercise Tolerance: Good negative cardio ROS Normal cardiovascular examI Rhythm:Regular Rate:Normal     Neuro/Psych Anxiety negative neurological ROS  negative psych ROS   GI/Hepatic negative GI ROS, Neg liver ROS,   Endo/Other  negative endocrine ROS  Renal/GU negative Renal ROS  negative genitourinary   Musculoskeletal negative musculoskeletal ROS (+)   Abdominal   Peds negative pediatric ROS (+)  Hematology negative hematology ROS (+)   Anesthesia Other Findings   Reproductive/Obstetrics negative OB ROS S/p  NSVD about one month prior                              Anesthesia Physical Anesthesia Plan  ASA: II  Anesthesia Plan: General   Post-op Pain Management:    Induction: Intravenous  PONV Risk Score and Plan:   Airway Management Planned: Oral ETT  Additional Equipment:   Intra-op Plan:   Post-operative Plan: Extubation in OR  Informed Consent: I have reviewed the patients History and Physical, chart, labs and discussed the procedure including the risks, benefits and alternatives for the proposed anesthesia with the patient or authorized representative who has indicated his/her understanding and acceptance.     Dental advisory given  Plan Discussed with: CRNA  Anesthesia Plan Comments: (GETA planned  Full PPE use planned  Case deemed appropriate by surgeon in light of current COVID concerns )        Anesthesia Quick Evaluation

## 2018-10-09 NOTE — Op Note (Signed)
Patient:  Miranda Campbell  DOB:  07/17/1995  MRN:  [id number]   Preop Diagnosis: Acute cholecystitis, cholelithiasis  Postop Diagnosis: Same  Procedure: Laparoscopic cholecystectomy  Surgeon: Franky Macho, MD  Assistant: Algis Greenhouse, MD  Anes: General endotracheal  Indications: Patient is a 23 year old postpartum white female with a known history of cholelithiasis who presents with ongoing nausea and right upper quadrant abdominal pain.  The risks and benefits of the procedure including bleeding, infection, hepatobiliary injury, the possibility of an open procedure were fully explained to the patient, who gave informed consent.  Procedure note: Patient was placed in the supine position.  After induction of general endotracheal anesthesia, the abdomen was prepped and draped using the usual sterile technique with ChloraPrep.  Surgical site confirmation was performed.  An infraumbilical incision was made down to the fascia.  A Veress needle was introduced into the abdominal cavity and confirmation of placement was done using the saline drop test.  The abdomen was then insufflated to 15 mmHg pressure.  An 11 mm trocar was then introduced into the abdominal cavity under direct visualization without difficulty.  The patient was placed in reverse Trendelenburg position and an additional 1 mm trocar was placed in the epigastric region and 5 mm trochars were placed in the right upper quadrant and right flank regions.  The liver was inspected and noted to be within normal limits.  The gallbladder was retracted in a dynamic fashion in order to provide a critical view of the triangle of Calot.  The cystic duct was first identified.  Its juncture to the infundibulum was fully identified.  It appeared that the cystic artery was in close proximity to the cystic duct.  Both were ligated and divided using clips.  The gallbladder was then freed away from the gallbladder fossa using Bovie electrocautery.   Surgicel was placed in the gallbladder fossa.  All fluid and air were then evacuated from the abdominal cavity and the gallbladder was removed using an Endo Catch bag without difficulty.  The infraumbilical fascia was reapproximated using an 0 Vicryl interrupted suture.  All skin incisions were closed using a 4-0 Monocryl subcuticular suture.  Dermabond was applied to all wounds.  All tape and needle counts were correct at the end of the procedure.  The patient was extubated in the operating room and transferred to PACU in stable condition.  Complications: None  EBL: Minimal  Specimen: Gallbladder

## 2018-10-09 NOTE — Transfer of Care (Signed)
Immediate Anesthesia Transfer of Care Note  Patient: Miranda Campbell  Procedure(s) Performed: LAPAROSCOPIC CHOLECYSTECTOMY (N/A )  Patient Location: PACU  Anesthesia Type:General  Level of Consciousness: awake and patient cooperative  Airway & Oxygen Therapy: Patient Spontanous Breathing and Patient connected to nasal cannula oxygen  Post-op Assessment: Report given to RN, Post -op Vital signs reviewed and stable and Patient moving all extremities  Post vital signs: Reviewed and stable  Last Vitals:  Vitals Value Taken Time  BP    Temp    Pulse 93 10/09/2018  8:21 AM  Resp 23 10/09/2018  8:21 AM  SpO2 95 % 10/09/2018  8:21 AM  Vitals shown include unvalidated device data.  Last Pain:  Vitals:   10/09/18 0645  TempSrc: Oral  PainSc: 0-No pain      Patients Stated Pain Goal: 8 (10/09/18 0645)  Complications: No apparent anesthesia complications

## 2018-10-09 NOTE — Anesthesia Procedure Notes (Signed)
Procedure Name: Intubation Date/Time: 10/09/2018 7:34 AM Performed by: Charmaine Downs, CRNA Pre-anesthesia Checklist: Patient identified, Patient being monitored, Timeout performed, Emergency Drugs available and Suction available Patient Re-evaluated:Patient Re-evaluated prior to induction Oxygen Delivery Method: Circle System Utilized Preoxygenation: Pre-oxygenation with 100% oxygen Induction Type: IV induction Ventilation: Mask ventilation without difficulty Laryngoscope Size: Mac and 3 Grade View: Grade I Tube type: Oral Tube size: 7.0 mm Number of attempts: 1 Airway Equipment and Method: stylet Placement Confirmation: ETT inserted through vocal cords under direct vision,  positive ETCO2 and breath sounds checked- equal and bilateral Secured at: 22 cm Tube secured with: Tape Dental Injury: Teeth and Oropharynx as per pre-operative assessment

## 2018-10-09 NOTE — Discharge Instructions (Signed)
Laparoscopic Cholecystectomy, Care After  This sheet gives you information about how to care for yourself after your procedure. Your health care provider may also give you more specific instructions. If you have problems or questions, contact your health care provider.  What can I expect after the procedure?  After the procedure, it is common to have:   Pain at your incision sites. You will be given medicines to control this pain.   Mild nausea or vomiting.    Bloating and possible shoulder pain from the air-like gas that was used during the procedure.  Follow these instructions at home:  Incision care     Follow instructions from your health care provider about how to take care of your incisions. Make sure you:  ? Wash your hands with soap and water before you change your bandage (dressing). If soap and water are not available, use hand sanitizer.  ? Change your dressing as told by your health care provider.  ? Leave stitches (sutures), skin glue, or adhesive strips in place. These skin closures may need to be in place for 2 weeks or longer. If adhesive strip edges start to loosen and curl up, you may trim the loose edges. Do not remove adhesive strips completely unless your health care provider tells you to do that.   Do not take baths, swim, or use a hot tub until your health care provider approves. Ask your health care provider if you can take showers. You may only be allowed to take sponge baths for bathing.   Check your incision area every day for signs of infection. Check for:  ? More redness, swelling, or pain.  ? More fluid or blood.  ? Warmth.  ? Pus or a bad smell.  Activity   Do not drive or use heavy machinery while taking prescription pain medicine.   Do not lift anything that is heavier than 10 lb (4.5 kg) until your health care provider approves.   Do not play contact sports until your health care provider approves.   Do not drive for 24 hours if you were given a medicine to help you relax  (sedative).   Rest as needed. Do not return to work or school until your health care provider approves.  General instructions   Take over-the-counter and prescription medicines only as told by your health care provider.   To prevent or treat constipation while you are taking prescription pain medicine, your health care provider may recommend that you:  ? Drink enough fluid to keep your urine clear or pale yellow.  ? Take over-the-counter or prescription medicines.  ? Eat foods that are high in fiber, such as fresh fruits and vegetables, whole grains, and beans.  ? Limit foods that are high in fat and processed sugars, such as fried and sweet foods.  Contact a health care provider if:   You develop a rash.   You have more redness, swelling, or pain around your incisions.   You have more fluid or blood coming from your incisions.   Your incisions feel warm to the touch.   You have pus or a bad smell coming from your incisions.   You have a fever.   One or more of your incisions breaks open.  Get help right away if:   You have trouble breathing.   You have chest pain.   You have increasing pain in your shoulders.   You faint or feel dizzy when you stand.   You have   severe pain in your abdomen.   You have nausea or vomiting that lasts for more than one day.   You have leg pain.  This information is not intended to replace advice given to you by your health care provider. Make sure you discuss any questions you have with your health care provider.  Document Released: 05/22/2005 Document Revised: 12/11/2015 Document Reviewed: 11/08/2015  Elsevier Interactive Patient Education  2019 [employer]

## 2018-10-09 NOTE — Anesthesia Postprocedure Evaluation (Signed)
Anesthesia Post Note  Patient: Denita Mattison  Procedure(s) Performed: LAPAROSCOPIC CHOLECYSTECTOMY (N/A )  Patient location during evaluation: PACU Anesthesia Type: General Level of consciousness: awake and patient cooperative Pain management: pain level controlled Vital Signs Assessment: post-procedure vital signs reviewed and stable Respiratory status: spontaneous breathing, nonlabored ventilation and respiratory function stable Cardiovascular status: blood pressure returned to baseline Postop Assessment: no apparent nausea or vomiting Anesthetic complications: no     Last Vitals:  Vitals:   10/09/18 0822 10/09/18 0830  BP: 125/62 127/73  Pulse: 93 83  Resp: (!) 23 20  Temp: (!) 36.4 C   SpO2: 95% 99%    Last Pain:  Vitals:   10/09/18 0822  TempSrc:   PainSc: 3                  Saysha Menta J

## 2018-10-10 ENCOUNTER — Encounter (HOSPITAL_COMMUNITY): Payer: Self-pay | Admitting: General Surgery

## 2019-05-28 IMAGING — CT CT ABDOMEN AND PELVIS WITH CONTRAST
2 of 4 series · 16 of 46 positions shown, 18 images · IV contrast (Isovue)
Comparison: None.

CLINICAL DATA: Right upper quadrant pain. Suspect cholecystitis.
Three weeks postpartum

EXAM:
CT ABDOMEN AND PELVIS WITH CONTRAST
TECHNIQUE: Multidetector CT imaging of the abdomen and pelvis was performed
using the standard protocol following bolus administration of
intravenous contrast.
CONTRAST:  100mL OMNIPAQUE IOHEXOL 300 MG/ML  SOLN

[Series 2: axial st · axial · 0.73mm/px · z∈[-478,-23]mm · 13 of 99 slices shown, 15 images]
[im 4/99  soft-tissue]
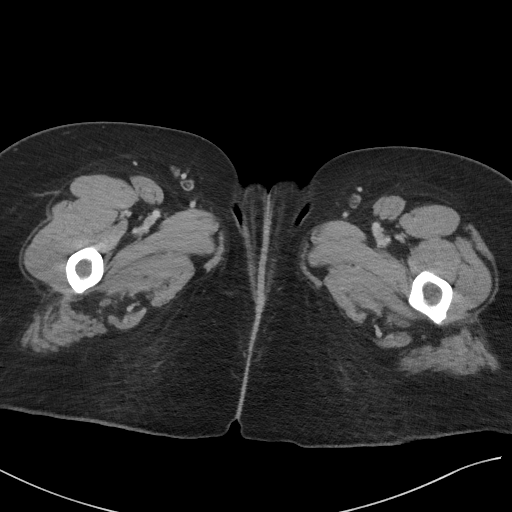
[im 4/99  bone]
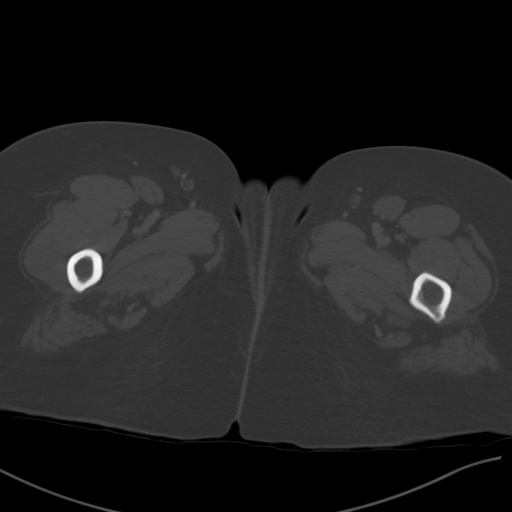
[im 12/99  soft-tissue]
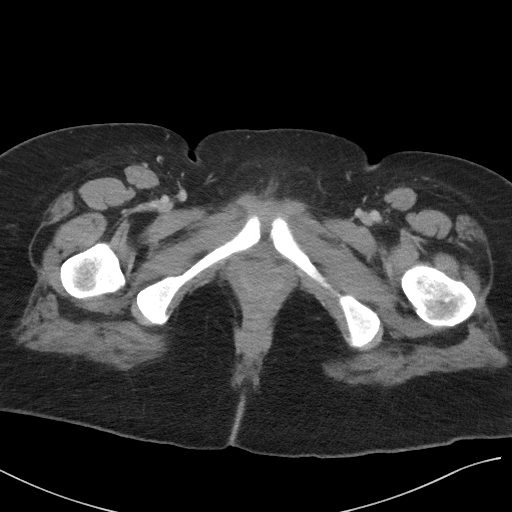
[im 20/99  soft-tissue]
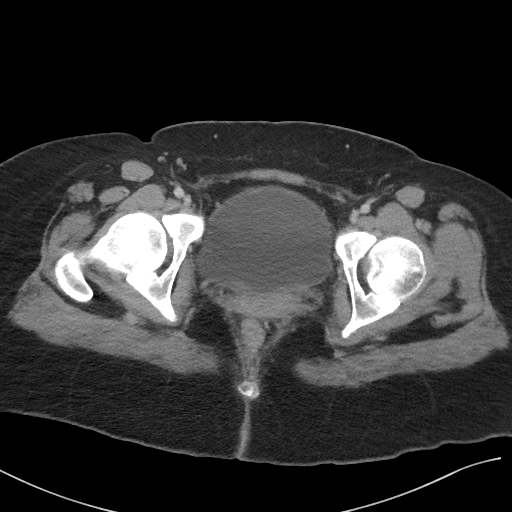
[im 28/99  soft-tissue]
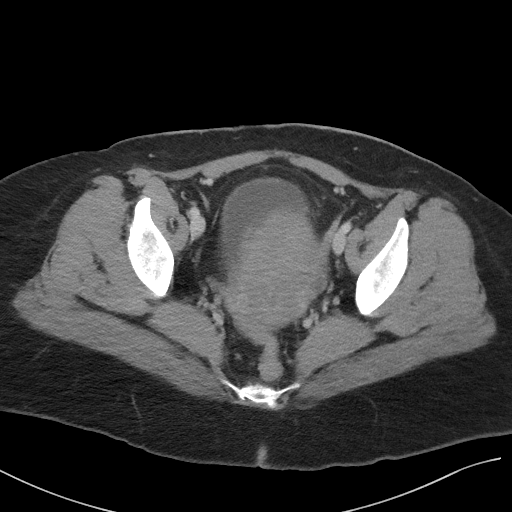
[im 36/99  soft-tissue]
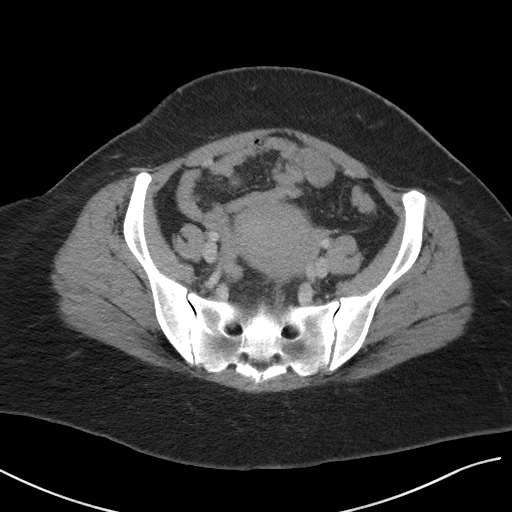
[im 44/99  soft-tissue]
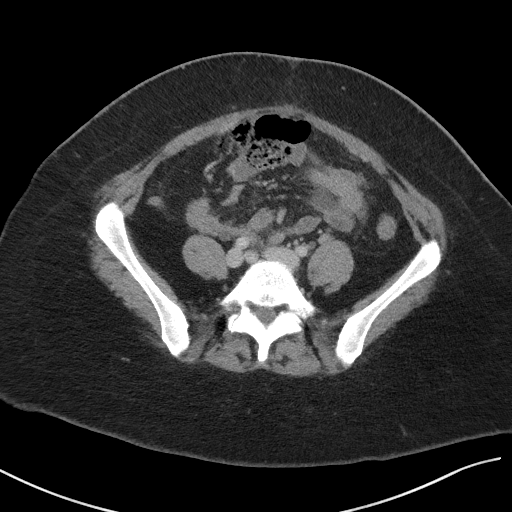
[im 51/99  soft-tissue]
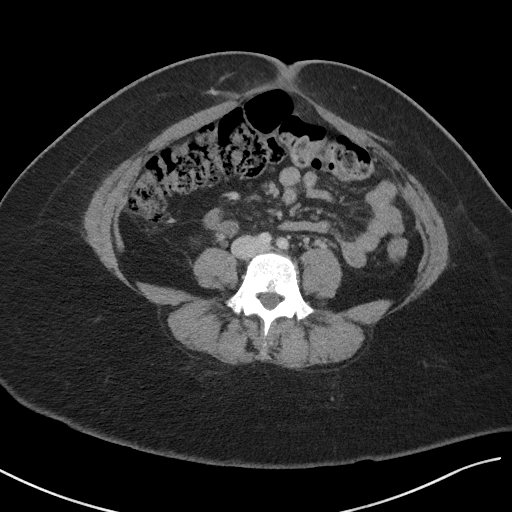
[im 55/99  soft-tissue]
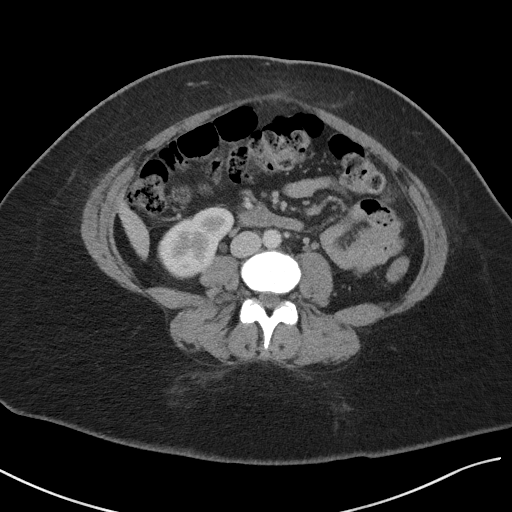
[im 63/99  soft-tissue]
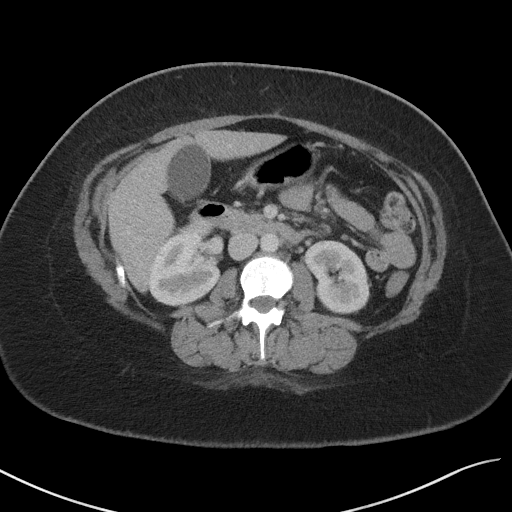
[im 63/99  bone]
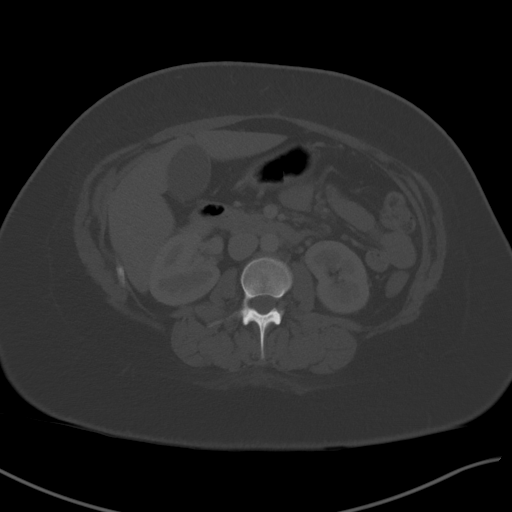
[im 71/99  soft-tissue]
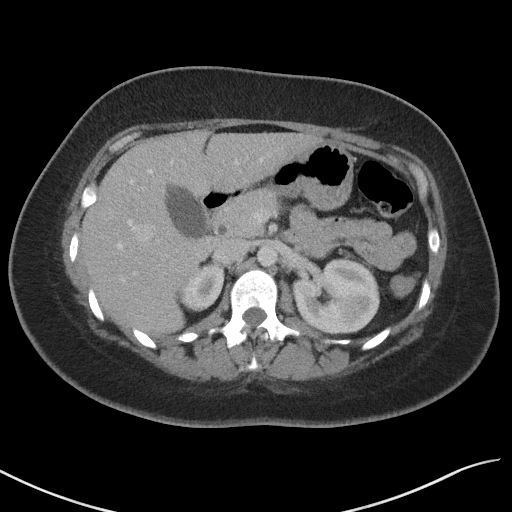
[im 79/99  soft-tissue]
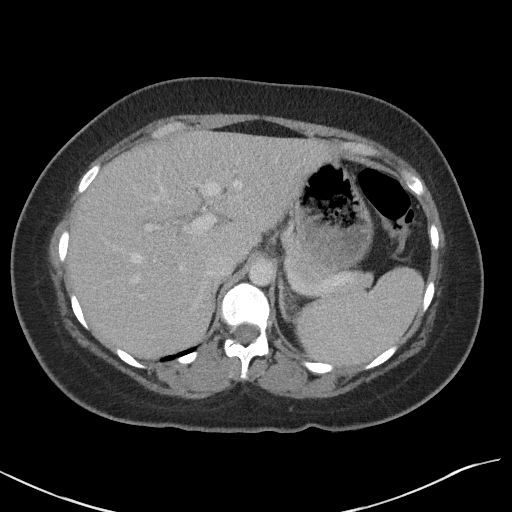
[im 87/99  soft-tissue]
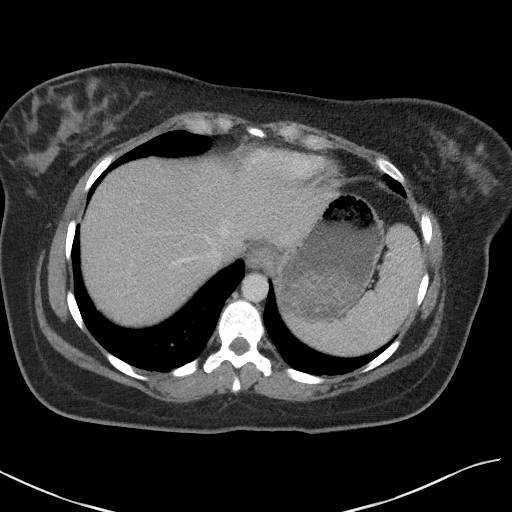
[im 95/99  soft-tissue]
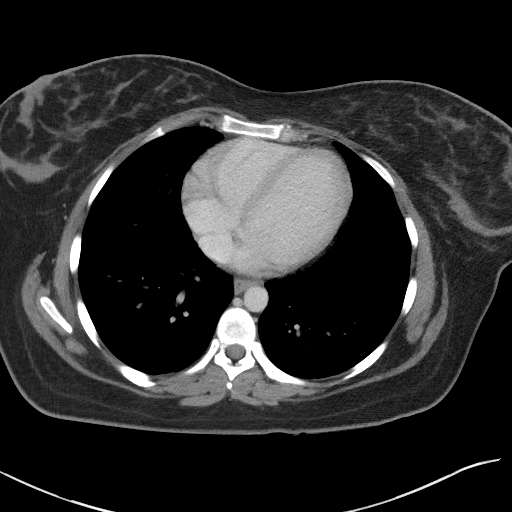

[Series 5: coronal st · coronal · 0.67mm/px · 3 of 87 slices shown]
[im 29/87  soft-tissue]
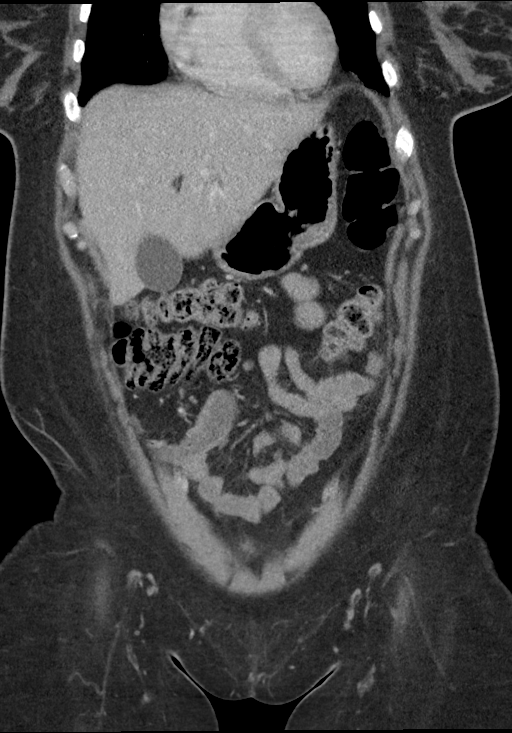
[im 39/87  soft-tissue]
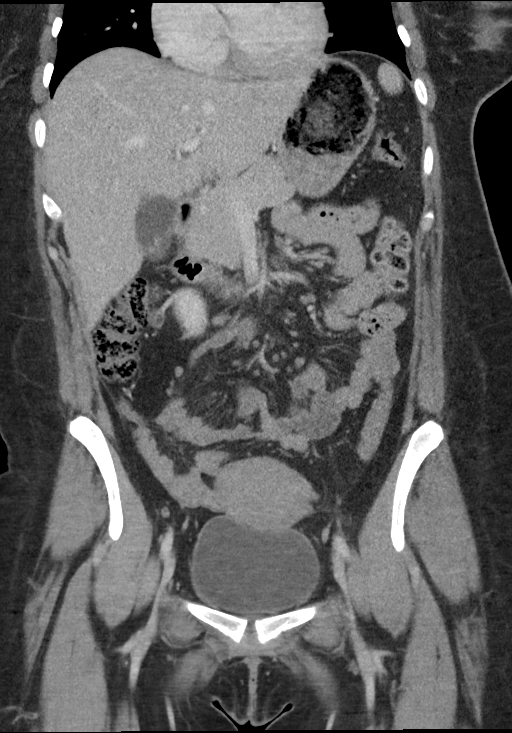
[im 48/87  soft-tissue]
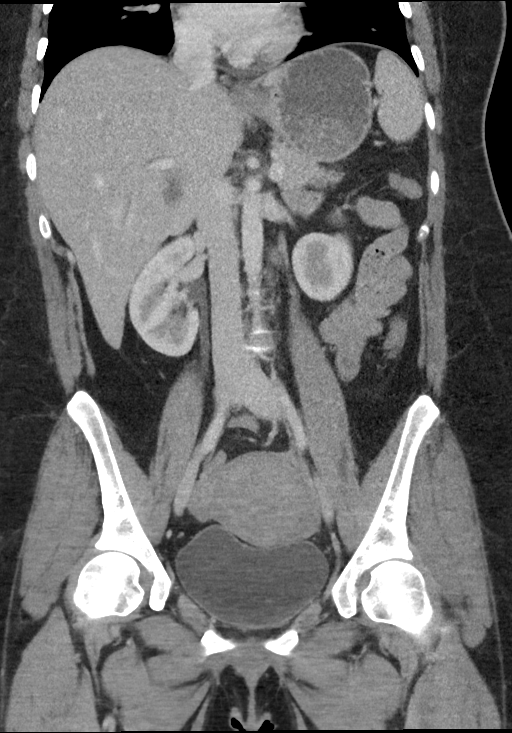

[16 of 46 positions shown; findings below may reference images not displayed]

FINDINGS: Lower chest: Lung bases clear.

Hepatobiliary: Small gallstones without gallbladder wall thickening.
No pericholecystic fluid or edema. No biliary dilatation. Normal
liver.

Pancreas: Negative

Spleen: Negative

Adrenals/Urinary Tract: Adrenal glands are unremarkable. Kidneys are
normal, without renal calculi, focal lesion, or hydronephrosis.
Bladder is unremarkable.

Stomach/Bowel: Negative for bowel obstruction. Negative for bowel
mass or edema. Normal appendix

Vascular/Lymphatic: Negative

Reproductive: Diffusely enlarged uterus due to recent delivery. No
focal abnormality. No pelvic mass.

Other: No free fluid

Musculoskeletal: Negative
IMPRESSION: Small gallstones without evidence of acute cholecystitis

Normal appendix

## 2021-01-12 ENCOUNTER — Ambulatory Visit (INDEPENDENT_AMBULATORY_CARE_PROVIDER_SITE_OTHER): Payer: PRIVATE HEALTH INSURANCE | Admitting: *Deleted

## 2021-01-12 ENCOUNTER — Other Ambulatory Visit: Payer: Self-pay

## 2021-01-12 ENCOUNTER — Encounter: Payer: Self-pay | Admitting: *Deleted

## 2021-01-12 DIAGNOSIS — Z3201 Encounter for pregnancy test, result positive: Secondary | ICD-10-CM | POA: Diagnosis not present

## 2021-01-12 LAB — POCT URINE PREGNANCY: Preg Test, Ur: POSITIVE — AB

## 2021-01-12 NOTE — Progress Notes (Signed)
   NURSE VISIT- PREGNANCY CONFIRMATION   SUBJECTIVE:  Miranda Campbell is a 25 y.o. G10P1001 female at [redacted]w[redacted]d by certain LMP of Patient's last menstrual period was 12/01/2020 (exact date). Here for pregnancy confirmation.  Home pregnancy test: positive x 2   She reports no complaints.  She is taking prenatal vitamins.    OBJECTIVE:  LMP 12/01/2020 (Exact Date)   Appears well, in no apparent distress  Results for orders placed or performed in visit on 01/12/21 (from the past 24 hour(s))  POCT urine pregnancy   Collection Time: 01/12/21  9:09 AM  Result Value Ref Range   Preg Test, Ur Positive (A) Negative    ASSESSMENT: Positive pregnancy test, [redacted]w[redacted]d by LMP    PLAN: Schedule for dating ultrasound in 3 weeks Prenatal vitamins: continue   Nausea medicines: not currently needed   OB packet given: Yes  Annamarie Dawley  01/12/2021 9:11 AM

## 2021-01-12 NOTE — Progress Notes (Signed)
Chart reviewed for nurse visit. Agree with plan of care.  Adline Potter, NP 01/12/2021 11:03 AM

## 2021-02-01 ENCOUNTER — Other Ambulatory Visit: Payer: Self-pay | Admitting: Adult Health

## 2021-02-01 DIAGNOSIS — O3680X0 Pregnancy with inconclusive fetal viability, not applicable or unspecified: Secondary | ICD-10-CM

## 2021-02-02 ENCOUNTER — Ambulatory Visit (INDEPENDENT_AMBULATORY_CARE_PROVIDER_SITE_OTHER): Payer: PRIVATE HEALTH INSURANCE

## 2021-02-02 ENCOUNTER — Other Ambulatory Visit: Payer: Self-pay

## 2021-02-02 DIAGNOSIS — Z3A09 9 weeks gestation of pregnancy: Secondary | ICD-10-CM | POA: Diagnosis not present

## 2021-02-02 DIAGNOSIS — O3680X0 Pregnancy with inconclusive fetal viability, not applicable or unspecified: Secondary | ICD-10-CM

## 2021-02-02 NOTE — Progress Notes (Signed)
[place] 7+5 wks,single IUP with YS,FHR 160 bpm,CRL 14.16 mm,normal ovaries

## 2021-03-03 ENCOUNTER — Other Ambulatory Visit: Payer: Self-pay | Admitting: Obstetrics & Gynecology

## 2021-03-03 DIAGNOSIS — Z3682 Encounter for antenatal screening for nuchal translucency: Secondary | ICD-10-CM

## 2021-03-04 ENCOUNTER — Other Ambulatory Visit: Payer: Self-pay

## 2021-03-04 ENCOUNTER — Encounter: Payer: Self-pay | Admitting: Women's Health

## 2021-03-04 ENCOUNTER — Ambulatory Visit (INDEPENDENT_AMBULATORY_CARE_PROVIDER_SITE_OTHER): Payer: PRIVATE HEALTH INSURANCE

## 2021-03-04 ENCOUNTER — Ambulatory Visit: Payer: PRIVATE HEALTH INSURANCE | Admitting: *Deleted

## 2021-03-04 ENCOUNTER — Other Ambulatory Visit (HOSPITAL_COMMUNITY)
Admission: RE | Admit: 2021-03-04 | Discharge: 2021-03-04 | Disposition: A | Discharge status: Home / Self Care | Payer: PRIVATE HEALTH INSURANCE | Source: Ambulatory Visit | Attending: Women's Health | Admitting: Women's Health

## 2021-03-04 ENCOUNTER — Ambulatory Visit (INDEPENDENT_AMBULATORY_CARE_PROVIDER_SITE_OTHER): Payer: PRIVATE HEALTH INSURANCE | Admitting: Women's Health

## 2021-03-04 VITALS — BP 116/67 | HR 74 | Wt 202.0 lb

## 2021-03-04 DIAGNOSIS — Z3481 Encounter for supervision of other normal pregnancy, first trimester: Secondary | ICD-10-CM

## 2021-03-04 DIAGNOSIS — Z3480 Encounter for supervision of other normal pregnancy, unspecified trimester: Secondary | ICD-10-CM | POA: Diagnosis not present

## 2021-03-04 DIAGNOSIS — Z113 Encounter for screening for infections with a predominantly sexual mode of transmission: Secondary | ICD-10-CM | POA: Diagnosis present

## 2021-03-04 DIAGNOSIS — Z3682 Encounter for antenatal screening for nuchal translucency: Secondary | ICD-10-CM

## 2021-03-04 DIAGNOSIS — Z124 Encounter for screening for malignant neoplasm of cervix: Secondary | ICD-10-CM | POA: Insufficient documentation

## 2021-03-04 DIAGNOSIS — Z3A12 12 weeks gestation of pregnancy: Secondary | ICD-10-CM | POA: Insufficient documentation

## 2021-03-04 DIAGNOSIS — Z3490 Encounter for supervision of normal pregnancy, unspecified, unspecified trimester: Secondary | ICD-10-CM | POA: Insufficient documentation

## 2021-03-04 LAB — POCT URINALYSIS DIPSTICK OB
Blood, UA: NEGATIVE
Glucose, UA: NEGATIVE
Ketones, UA: NEGATIVE
Leukocytes, UA: NEGATIVE
Nitrite, UA: NEGATIVE
POC,PROTEIN,UA: NEGATIVE

## 2021-03-04 NOTE — Progress Notes (Signed)
INITIAL OBSTETRICAL VISIT Patient name: Miranda Campbell MRN [id number]  Date of birth: Feb 15, 1996 Chief Complaint:   Initial Prenatal Visit  History of Present Illness:   Miranda Campbell is a 25 y.o. G89P1001 Caucasian female at [redacted]w[redacted]d by [place] at 7 weeks with an Estimated Date of Delivery: 09/16/21 being seen today for her initial obstetrical visit.   Patient's last menstrual period was 12/01/2020 (exact date). Her obstetrical history is significant for  term uncomplicated SVB x 1 .   Today she reports  n/v improving .  Last pap 2018 . Results were: negative per pt report at [facility]  Depression screen [facility] 2/9 03/04/2021 01/28/2018  Decreased Interest 1 3  Down, Depressed, Hopeless 0 1  PHQ - 2 Score 1 4  Altered sleeping 0 0  Tired, decreased energy 2 2  Change in appetite 2 2  Feeling bad or failure about yourself  0 0  Trouble concentrating 0 0  Moving slowly or fidgety/restless 0 0  Suicidal thoughts 0 0  PHQ-9 Score 5 8  Difficult doing work/chores - Not difficult at all     GAD 7 : Generalized Anxiety Score 03/04/2021  Nervous, Anxious, on Edge 2  Control/stop worrying 1  Worry too much - different things 2  Trouble relaxing 1  Restless 1  Easily annoyed or irritable 2  Afraid - awful might happen 2  Total GAD 7 Score 11     Review of Systems:   Pertinent items are noted in HPI Denies cramping/contractions, leakage of fluid, vaginal bleeding, abnormal vaginal discharge w/ itching/odor/irritation, headaches, visual changes, shortness of breath, chest pain, abdominal pain, severe nausea/vomiting, or problems with urination or bowel movements unless otherwise stated above.  Pertinent History Reviewed:  Reviewed past medical,surgical, social, obstetrical and family history.  Reviewed problem list, medications and allergies. OB History  Gravida Para Term Preterm AB Living  2 1 1     1   SAB IAB Ectopic Multiple Live Births        0 1    # Outcome Date GA Lbr Len/2nd  Weight Sex Delivery Anes PTL Lv  2 Current           1 Term 09/02/18 [redacted]w[redacted]d 15:08 / 01:42 8 lb 1.6 oz (3.674 kg) F Vag-Spont EPI N LIV   Physical Assessment:   Vitals:   03/04/21 1024  BP: 116/67  Pulse: 74  Weight: 202 lb (91.6 kg)  Body mass index is 35.78 kg/m.       Physical Examination:  General appearance - well appearing, and in no distress  Mental status - alert, oriented to person, place, and time  Psych:  She has a normal mood and affect  Skin - warm and dry, normal color, no suspicious lesions noted  Chest - effort normal, all lung fields clear to auscultation bilaterally  Heart - normal rate and regular rhythm  Abdomen - soft, nontender  Extremities:  No swelling or varicosities noted  Pelvic - VULVA: normal appearing vulva with no masses, tenderness or lesions  VAGINA: normal appearing vagina with normal color and discharge, no lesions  CERVIX: normal appearing cervix without discharge or lesions, no CMT  Thin prep pap is done w/ reflex HR HPV cotesting  Chaperone: 03/06/21    TODAY'S NT Faith Rogue 12 wks,measurements c/w dates,CRL 57.72 mm,NB present,NT 1.2 mm,anterior placenta gr 0,fhr 153 bpm  Results for orders placed or performed in visit on 03/04/21 (from the past 24 hour(s))  POC Urinalysis  Dipstick OB   Collection Time: 03/04/21 10:50 AM  Result Value Ref Range   Color, UA     Clarity, UA     Glucose, UA Negative Negative   Bilirubin, UA     Ketones, UA neg    Spec Grav, UA     Blood, UA neg    pH, UA     POC,PROTEIN,UA Negative Negative, Trace, Small (1+), Moderate (2+), Large (3+), 4+   Urobilinogen, UA     Nitrite, UA neg    Leukocytes, UA Negative Negative   Appearance     Odor      Assessment & Plan:  1) Low-Risk Pregnancy G2P1001 at [redacted]w[redacted]d with an Estimated Date of Delivery: 09/16/21   2) Initial OB visit   Meds: No orders of the defined types were placed in this encounter.   Initial labs obtained Continue prenatal vitamins Reviewed  n/v relief measures and warning s/s to report Reviewed recommended weight gain based on pre-gravid BMI Encouraged well-balanced diet Genetic & carrier screening discussed: requests Panorama and NT/IT, declines Horizon  Ultrasound discussed; fetal survey: requested CCNC completed> form faxed if has or is planning to apply for medicaid The nature of [place] [facility] [employer] with multiple MDs and other Advanced Practice Providers was explained to patient; also emphasized that fellows, residents, and students are part of our team. Does not have home bp cuff. Office bp cuff given: none available. Rx sent: no. Check bp weekly, let us know if consistently >140/90.   Follow-up: Return in about 4 weeks (around 04/01/2021) for LROB, 2nd IT, CNM, in person.   Orders Placed This Encounter  Procedures   Urine Culture   Integrated 1   Pain Management Screening Profile (10S)   CBC/D/Plt+RPR+Rh+ABO+RubIgG...   Genetic Screening   POC Urinalysis Dipstick OB    Cheral Marker CNM, [facility] 03/04/2021 11:23 AM

## 2021-03-04 NOTE — Patient Instructions (Signed)
[name], thank you for choosing our office today! We appreciate the opportunity to meet your healthcare needs. You may receive a short survey by mail, e-mail, or through MyChart. If you are happy with your care we would appreciate if you could take just a few minutes to complete the survey questions. We read all of your comments and take your feedback very seriously. Thank you again for choosing our office.  [facility] ([address] [place], [place] [address]) Entrance C, located off of [address] Free 24/7 valet parking   Nausea & Vomiting Have saltine crackers or pretzels by your bed and eat a few bites before you raise your head out of bed in the morning Eat small frequent meals throughout the day instead of large meals Drink plenty of fluids throughout the day to stay hydrated, just don't drink a lot of fluids with your meals.  This can make your stomach fill up faster making you feel sick Do not brush your teeth right after you eat Products with real ginger are good for nausea, like ginger ale and ginger hard candy Make sure it says made with real ginger! Sucking on sour candy like lemon heads is also good for nausea If your prenatal vitamins make you nauseated, take them at night so you will sleep through the nausea Sea Bands If you feel like you need medicine for the nausea & vomiting please let us know If you are unable to keep any fluids or food down please let us know   Constipation Drink plenty of fluid, preferably water, throughout the day Eat foods high in fiber such as fruits, vegetables, and grains Exercise, such as walking, is a good way to keep your bowels regular Drink warm fluids, especially warm prune juice, or decaf coffee Eat a 1/2 cup of real oatmeal (not instant), 1/2 cup applesauce, and 1/2-1 cup warm prune juice every day If needed, you may take Colace (docusate sodium) stool softener  once or twice a day to help keep the stool soft.  If you still are having problems with constipation, you may take Miralax once daily as needed to help keep your bowels regular.   Home Blood Pressure Monitoring for Patients   Your provider has recommended that you check your blood pressure (BP) at least once a week at home. If you do not have a blood pressure cuff at home, one will be provided for you. Contact your provider if you have not received your monitor within 1 week.   Helpful Tips for Accurate Home Blood Pressure Checks  Don't smoke, exercise, or drink caffeine 30 minutes before checking your BP Use the restroom before checking your BP (a full bladder can raise your pressure) Relax in a comfortable upright chair Feet on the ground Left arm resting comfortably on a flat surface at the level of your heart Legs uncrossed Back supported Sit quietly and don't talk Place the cuff on your bare arm Adjust snuggly, so that only two fingertips can fit between your skin and the top of the cuff Check 2 readings separated by at least one minute Keep a log of your BP readings For a visual, please reference this diagram: http://ccnc.care/bpdiagram  Provider Name: [facility]     Phone: [phone]  Zone 1: ALL CLEAR  Continue to monitor your symptoms:  BP reading is less than 140 (top number) or less than 90 (bottom   number)  No right upper stomach pain No headaches or seeing spots No feeling nauseated or throwing up No swelling in face and hands  Zone 2: CAUTION Call your doctor's office for any of the following:  BP reading is greater than 140 (top number) or greater than 90 (bottom number)  Stomach pain under your ribs in the middle or right side Headaches or seeing spots Feeling nauseated or throwing up Swelling in face and hands  Zone 3: EMERGENCY  Seek immediate medical care if you have any of the following:  BP reading is greater than160 (top number) or greater than  110 (bottom number) Severe headaches not improving with Tylenol Serious difficulty catching your breath Any worsening symptoms from Zone 2    First Trimester of Pregnancy The first trimester of pregnancy is from week 1 until the end of week 12 (months 1 through 3). A week after a sperm fertilizes an egg, the egg will implant on the wall of the uterus. This embryo will begin to develop into a baby. Genes from you and your partner are forming the baby. The female genes determine whether the baby is a boy or a girl. At 6-8 weeks, the eyes and face are formed, and the heartbeat can be seen on ultrasound. At the end of 12 weeks, all the baby's organs are formed.  Now that you are pregnant, you will want to do everything you can to have a healthy baby. Two of the most important things are to get good prenatal care and to follow your health care provider's instructions. Prenatal care is all the medical care you receive before the baby's birth. This care will help prevent, find, and treat any problems during the pregnancy and childbirth. BODY CHANGES Your body goes through many changes during pregnancy. The changes vary from woman to woman.  You may gain or lose a couple of pounds at first. You may feel sick to your stomach (nauseous) and throw up (vomit). If the vomiting is uncontrollable, call your health care provider. You may tire easily. You may develop headaches that can be relieved by medicines approved by your health care provider. You may urinate more often. Painful urination may mean you have a bladder infection. You may develop heartburn as a result of your pregnancy. You may develop constipation because certain hormones are causing the muscles that push waste through your intestines to slow down. You may develop hemorrhoids or swollen, bulging veins (varicose veins). Your breasts may begin to grow larger and become tender. Your nipples may stick out more, and the tissue that surrounds them  (areola) may become darker. Your gums may bleed and may be sensitive to brushing and flossing. Dark spots or blotches (chloasma, mask of pregnancy) may develop on your face. This will likely fade after the baby is born. Your menstrual periods will stop. You may have a loss of appetite. You may develop cravings for certain kinds of food. You may have changes in your emotions from day to day, such as being excited to be pregnant or being concerned that something may go wrong with the pregnancy and baby. You may have more vivid and strange dreams. You may have changes in your hair. These can include thickening of your hair, rapid growth, and changes in texture. Some women also have hair loss during or after pregnancy, or hair that feels dry or thin. Your hair will most likely return to normal after your baby is born. WHAT TO EXPECT AT YOUR PRENATAL   VISITS During a routine prenatal visit: You will be weighed to make sure you and the baby are growing normally. Your blood pressure will be taken. Your abdomen will be measured to track your baby's growth. The fetal heartbeat will be listened to starting around week 10 or 12 of your pregnancy. Test results from any previous visits will be discussed. Your health care provider may ask you: How you are feeling. If you are feeling the baby move. If you have had any abnormal symptoms, such as leaking fluid, bleeding, severe headaches, or abdominal cramping. If you have any questions. Other tests that may be performed during your first trimester include: Blood tests to find your blood type and to check for the presence of any previous infections. They will also be used to check for low iron levels (anemia) and Rh antibodies. Later in the pregnancy, blood tests for diabetes will be done along with other tests if problems develop. Urine tests to check for infections, diabetes, or protein in the urine. An ultrasound to confirm the proper growth and development  of the baby. An amniocentesis to check for possible genetic problems. Fetal screens for spina bifida and Down syndrome. You may need other tests to make sure you and the baby are doing well. HOME CARE INSTRUCTIONS  Medicines Follow your health care provider's instructions regarding medicine use. Specific medicines may be either safe or unsafe to take during pregnancy. Take your prenatal vitamins as directed. If you develop constipation, try taking a stool softener if your health care provider approves. Diet Eat regular, well-balanced meals. Choose a variety of foods, such as meat or vegetable-based protein, fish, milk and low-fat dairy products, vegetables, fruits, and whole grain breads and cereals. Your health care provider will help you determine the amount of weight gain that is right for you. Avoid raw meat and uncooked cheese. These carry germs that can cause birth defects in the baby. Eating four or five small meals rather than three large meals a day may help relieve nausea and vomiting. If you start to feel nauseous, eating a few soda crackers can be helpful. Drinking liquids between meals instead of during meals also seems to help nausea and vomiting. If you develop constipation, eat more high-fiber foods, such as fresh vegetables or fruit and whole grains. Drink enough fluids to keep your urine clear or pale yellow. Activity and Exercise Exercise only as directed by your health care provider. Exercising will help you: Control your weight. Stay in shape. Be prepared for labor and delivery. Experiencing pain or cramping in the lower abdomen or low back is a good sign that you should stop exercising. Check with your health care provider before continuing normal exercises. Try to avoid standing for long periods of time. Move your legs often if you must stand in one place for a long time. Avoid heavy lifting. Wear low-heeled shoes, and practice good posture. You may continue to have sex  unless your health care provider directs you otherwise. Relief of Pain or Discomfort Wear a good support bra for breast tenderness.   Take warm sitz baths to soothe any pain or discomfort caused by hemorrhoids. Use hemorrhoid cream if your health care provider approves.   Rest with your legs elevated if you have leg cramps or low back pain. If you develop varicose veins in your legs, wear support hose. Elevate your feet for 15 minutes, 3-4 times a day. Limit salt in your diet. Prenatal Care Schedule your prenatal visits by the   twelfth week of pregnancy. They are usually scheduled monthly at first, then more often in the last 2 months before delivery. Write down your questions. Take them to your prenatal visits. Keep all your prenatal visits as directed by your health care provider. Safety Wear your seat belt at all times when driving. Make a list of emergency phone numbers, including numbers for family, friends, the hospital, and police and fire departments. General Tips Ask your health care provider for a referral to a local prenatal education class. Begin classes no later than at the beginning of month 6 of your pregnancy. Ask for help if you have counseling or nutritional needs during pregnancy. Your health care provider can offer advice or refer you to specialists for help with various needs. Do not use hot tubs, steam rooms, or saunas. Do not douche or use tampons or scented sanitary pads. Do not cross your legs for long periods of time. Avoid cat litter boxes and soil used by cats. These carry germs that can cause birth defects in the baby and possibly loss of the fetus by miscarriage or stillbirth. Avoid all smoking, herbs, alcohol, and medicines not prescribed by your health care provider. Chemicals in these affect the formation and growth of the baby. Schedule a dentist appointment. At home, brush your teeth with a soft toothbrush and be gentle when you floss. SEEK MEDICAL CARE IF:   You have dizziness. You have mild pelvic cramps, pelvic pressure, or nagging pain in the abdominal area. You have persistent nausea, vomiting, or diarrhea. You have a bad smelling vaginal discharge. You have pain with urination. You notice increased swelling in your face, hands, legs, or ankles. SEEK IMMEDIATE MEDICAL CARE IF:  You have a fever. You are leaking fluid from your vagina. You have spotting or bleeding from your vagina. You have severe abdominal cramping or pain. You have rapid weight gain or loss. You vomit blood or material that looks like coffee grounds. You are exposed to German measles and have never had them. You are exposed to fifth disease or chickenpox. You develop a severe headache. You have shortness of breath. You have any kind of trauma, such as from a fall or a car accident. Document Released: 05/16/2001 Document Revised: 10/06/2013 Document Reviewed: 04/01/2013 ExitCare Patient Information 2015 [employer]. This information is not intended to replace advice given to you by your health care provider. Make sure you discuss any questions you have with your health care provider.  

## 2021-03-04 NOTE — Progress Notes (Signed)
[place] 12 wks,measurements c/w dates,CRL 57.72 mm,NB present,NT 1.2 mm,anterior placenta gr 0,fhr 153 bpm

## 2021-03-05 LAB — PMP SCREEN PROFILE (10S), URINE
Amphetamine Scrn, Ur: NEGATIVE ng/mL
BARBITURATE SCREEN URINE: NEGATIVE ng/mL
BENZODIAZEPINE SCREEN, URINE: NEGATIVE ng/mL
CANNABINOIDS UR QL SCN: NEGATIVE ng/mL
Cocaine (Metab) Scrn, Ur: NEGATIVE ng/mL
Creatinine(Crt), U: 32.3 mg/dL (ref 20.0–300.0)
Methadone Screen, Urine: NEGATIVE ng/mL
OXYCODONE+OXYMORPHONE UR QL SCN: NEGATIVE ng/mL
Opiate Scrn, Ur: NEGATIVE ng/mL
Ph of Urine: 7 (ref 4.5–8.9)
Phencyclidine Qn, Ur: NEGATIVE ng/mL
Propoxyphene Scrn, Ur: NEGATIVE ng/mL

## 2021-03-05 LAB — MED LIST OPTION NOT SELECTED

## 2021-03-07 LAB — CBC/D/PLT+RPR+RH+ABO+RUBIGG...
Antibody Screen: NEGATIVE
Basophils Absolute: 0 10*3/uL (ref 0.0–0.2)
Basos: 0 %
EOS (ABSOLUTE): 0 10*3/uL (ref 0.0–0.4)
Eos: 0 %
HCV Ab: <0.1 s/co ratio (ref 0.0–0.9)
HIV Screen 4th Generation wRfx: NONREACTIVE
Hematocrit: 41 % (ref 34.0–46.6)
Hemoglobin: 13.9 g/dL (ref 11.1–15.9)
Hepatitis B Surface Ag: NEGATIVE
Immature Grans (Abs): 0 10*3/uL (ref 0.0–0.1)
Immature Granulocytes: 0 %
Lymphocytes Absolute: 1.6 10*3/uL (ref 0.7–3.1)
Lymphs: 16 %
MCH: 30.3 pg (ref 26.6–33.0)
MCHC: 33.9 g/dL (ref 31.5–35.7)
MCV: 89 fL (ref 79–97)
Monocytes Absolute: 0.6 10*3/uL (ref 0.1–0.9)
Monocytes: 5 %
Neutrophils Absolute: 7.9 10*3/uL — ABNORMAL HIGH (ref 1.4–7.0)
Neutrophils: 79 %
Platelets: 347 10*3/uL (ref 150–450)
RBC: 4.59 x10E6/uL (ref 3.77–5.28)
RDW: 12.5 % (ref 11.7–15.4)
RPR Ser Ql: NONREACTIVE
Rh Factor: POSITIVE
Rubella Antibodies, IGG: 1.19 index (ref >0.99)
WBC: 10.1 10*3/uL (ref 3.4–10.8)

## 2021-03-07 LAB — INTEGRATED 1
Crown Rump Length: 57.7 mm
Gest. Age on Collection Date: 12.1 weeks
Maternal Age at EDD: 26.2 yr
Nuchal Translucency (NT): 1.2 mm
Number of Fetuses: 1
PAPP-A Value: 511.4 ng/mL
Weight: 202 [lb_av]

## 2021-03-07 LAB — HCV INTERPRETATION

## 2021-03-08 LAB — URINE CULTURE

## 2021-03-10 LAB — CYTOLOGY - PAP
Chlamydia: NEGATIVE
Comment: NEGATIVE
Comment: NEGATIVE
Comment: NORMAL
Diagnosis: NEGATIVE
Diagnosis: REACTIVE
High risk HPV: NEGATIVE
Neisseria Gonorrhea: NEGATIVE

## 2021-04-01 ENCOUNTER — Encounter: Payer: Self-pay | Admitting: Obstetrics & Gynecology

## 2021-04-01 ENCOUNTER — Other Ambulatory Visit: Payer: Self-pay

## 2021-04-01 ENCOUNTER — Ambulatory Visit (INDEPENDENT_AMBULATORY_CARE_PROVIDER_SITE_OTHER): Payer: PRIVATE HEALTH INSURANCE | Admitting: Obstetrics & Gynecology

## 2021-04-01 VITALS — BP 117/73 | HR 80 | Wt 196.2 lb

## 2021-04-01 DIAGNOSIS — Z1379 Encounter for other screening for genetic and chromosomal anomalies: Secondary | ICD-10-CM

## 2021-04-01 DIAGNOSIS — Z3402 Encounter for supervision of normal first pregnancy, second trimester: Secondary | ICD-10-CM

## 2021-04-01 NOTE — Progress Notes (Signed)
   LOW-RISK PREGNANCY VISIT Patient name: Miranda Campbell MRN [id number]  Date of birth: February 22, 1996 Chief Complaint:   Routine Prenatal Visit  History of Present Illness:   Miranda Campbell is a 25 y.o. G71P1001 female at [redacted]w[redacted]d with an Estimated Date of Delivery: 09/16/21 being seen today for ongoing management of a low-risk pregnancy.  Depression screen [facility] 2/9 03/04/2021 01/28/2018  Decreased Interest 1 3  Down, Depressed, Hopeless 0 1  PHQ - 2 Score 1 4  Altered sleeping 0 0  Tired, decreased energy 2 2  Change in appetite 2 2  Feeling bad or failure about yourself  0 0  Trouble concentrating 0 0  Moving slowly or fidgety/restless 0 0  Suicidal thoughts 0 0  PHQ-9 Score 5 8  Difficult doing work/chores - Not difficult at all    Today she reports no complaints. Contractions: Not present. Vag. Bleeding: None.  Movement: Present. denies leaking of fluid. Review of Systems:   Pertinent items are noted in HPI Denies abnormal vaginal discharge w/ itching/odor/irritation, headaches, visual changes, shortness of breath, chest pain, abdominal pain, severe nausea/vomiting, or problems with urination or bowel movements unless otherwise stated above. Pertinent History Reviewed:  Reviewed past medical,surgical, social, obstetrical and family history.  Reviewed problem list, medications and allergies.  Physical Assessment:   Vitals:   04/01/21 1017  BP: 117/73  Pulse: 80  Weight: 196 lb 3.2 oz (89 kg)  Body mass index is 34.76 kg/m.        Physical Examination:   General appearance: Well appearing, and in no distress  Mental status: Alert, oriented to person, place, and time  Skin: Warm & dry  Respiratory: Normal respiratory effort, no distress  Abdomen: Soft, gravid, nontender  Pelvic: Cervical exam deferred         Extremities: Edema: None  Psych:  mood and affect appropriate  Fetal Status: Fetal Heart Rate (bpm): 145   Movement: Present   Uterine size: below  umbilicus  Chaperone: n/a    No results found for this or any previous visit (from the past 24 hour(s)).   Assessment & Plan:  1) Low-risk pregnancy G2P1001 at [redacted]w[redacted]d with an Estimated Date of Delivery: 09/16/21   2) It-2/Panorama today, []  anatomy scan next visit   Meds: No orders of the defined types were placed in this encounter.  Labs/procedures today: genetic testing  Plan:  Continue routine obstetrical care  Next visit: prefers in person    Reviewed: Preterm labor symptoms and general obstetric precautions including but not limited to vaginal bleeding, contractions, leaking of fluid and fetal movement were reviewed in detail with the patient.  All questions were answered. Pt given bp cuff. Check bp weekly, let know if >140/90.   Follow-up: Return in about 4 weeks (around 04/29/2021) for LROB visit and anatomy scan.  Orders Placed This Encounter  Procedures   INTEGRATED 2    05/01/2021, DO Attending Obstetrician & Gynecologist, [facility] for [facility]

## 2021-04-05 LAB — INTEGRATED 2
AFP MoM: 0.91
Alpha-Fetoprotein: 26.9 ng/mL
Crown Rump Length: 57.7 mm
DIA MoM: 0.67
DIA Value: 94.9 pg/mL
Estriol, Unconjugated: 1.28 ng/mL
Gest. Age on Collection Date: 12.1 weeks
Gestational Age: 16.1 weeks
Maternal Age at EDD: 26.2 yr
Nuchal Translucency (NT): 1.2 mm
Nuchal Translucency MoM: 0.93
Number of Fetuses: 1
PAPP-A MoM: 0.88
PAPP-A Value: 511.4 ng/mL
Test Results:: NEGATIVE
Weight: 180 [lb_av]
Weight: 202 [lb_av]
hCG MoM: 0.86
hCG Value: 29 IU/mL
uE3 MoM: 1.37

## 2021-05-04 ENCOUNTER — Other Ambulatory Visit: Payer: Self-pay | Admitting: Obstetrics & Gynecology

## 2021-05-04 DIAGNOSIS — Z363 Encounter for antenatal screening for malformations: Secondary | ICD-10-CM

## 2021-05-05 ENCOUNTER — Ambulatory Visit (INDEPENDENT_AMBULATORY_CARE_PROVIDER_SITE_OTHER): Payer: PRIVATE HEALTH INSURANCE

## 2021-05-05 ENCOUNTER — Ambulatory Visit (INDEPENDENT_AMBULATORY_CARE_PROVIDER_SITE_OTHER): Payer: PRIVATE HEALTH INSURANCE | Admitting: Obstetrics & Gynecology

## 2021-05-05 ENCOUNTER — Other Ambulatory Visit: Payer: Self-pay

## 2021-05-05 ENCOUNTER — Encounter: Payer: Self-pay | Admitting: Obstetrics & Gynecology

## 2021-05-05 VITALS — BP 111/67 | HR 76 | Wt 197.2 lb

## 2021-05-05 DIAGNOSIS — Z3482 Encounter for supervision of other normal pregnancy, second trimester: Secondary | ICD-10-CM

## 2021-05-05 DIAGNOSIS — Z363 Encounter for antenatal screening for malformations: Secondary | ICD-10-CM

## 2021-05-05 DIAGNOSIS — Z3480 Encounter for supervision of other normal pregnancy, unspecified trimester: Secondary | ICD-10-CM

## 2021-05-05 DIAGNOSIS — Z3A20 20 weeks gestation of pregnancy: Secondary | ICD-10-CM

## 2021-05-05 NOTE — Progress Notes (Signed)
   LOW-RISK PREGNANCY VISIT Patient name: Miranda Campbell MRN [id number]  Date of birth: November 17, 1995 Chief Complaint:   Routine Prenatal Visit and Pregnancy Ultrasound  History of Present Illness:   Miranda Campbell is a 25 y.o. G70P1001 female at [redacted]w[redacted]d with an Estimated Date of Delivery: 09/16/21 being seen today for ongoing management of a low-risk pregnancy.   Depression screen [facility] 2/9 03/04/2021 01/28/2018  Decreased Interest 1 3  Down, Depressed, Hopeless 0 1  PHQ - 2 Score 1 4  Altered sleeping 0 0  Tired, decreased energy 2 2  Change in appetite 2 2  Feeling bad or failure about yourself  0 0  Trouble concentrating 0 0  Moving slowly or fidgety/restless 0 0  Suicidal thoughts 0 0  PHQ-9 Score 5 8  Difficult doing work/chores - Not difficult at all    Today she reports no complaints. Contractions: Not present. Vag. Bleeding: None.  Movement: Present. denies leaking of fluid. Review of Systems:   Pertinent items are noted in HPI Denies abnormal vaginal discharge w/ itching/odor/irritation, headaches, visual changes, shortness of breath, chest pain, abdominal pain, severe nausea/vomiting, or problems with urination or bowel movements unless otherwise stated above. Pertinent History Reviewed:  Reviewed past medical,surgical, social, obstetrical and family history.  Reviewed problem list, medications and allergies.  Physical Assessment:   Vitals:   05/05/21 1032  BP: 111/67  Pulse: 76  Weight: 197 lb 3.2 oz (89.4 kg)  Body mass index is 34.93 kg/m.        Physical Examination:   General appearance: Well appearing, and in no distress  Mental status: Alert, oriented to person, place, and time  Skin: Warm & dry  Respiratory: Normal respiratory effort, no distress  Abdomen: Soft, gravid, nontender  Pelvic: Cervical exam deferred         Extremities: Edema: None  Psych:  mood and affect appropriate  Fetal Status: Fetal Heart Rate (bpm): 145 Fundal Height: 20 cm Movement:  Present    Chaperone: n/a    No results found for this or any previous visit (from the past 24 hour(s)).   Assessment & Plan:  1) Low-risk pregnancy G2P1001 at [redacted]w[redacted]d with an Estimated Date of Delivery: 09/16/21    Meds: No orders of the defined types were placed in this encounter.  Labs/procedures today: none  Plan:  Continue routine obstetrical care  Next visit: prefers in person    Reviewed: Preterm labor symptoms and general obstetric precautions including but not limited to vaginal bleeding, contractions, leaking of fluid and fetal movement were reviewed in detail with the patient.  All questions were answered. Pt has home bp cuff. Check bp weekly, let us know if >140/90.   Follow-up: Return in about 4 weeks (around 06/02/2021) for LROB visit.  No orders of the defined types were placed in this encounter.   Myna Hidalgo, DO Attending Obstetrician & Gynecologist, [facility] for [employer], [facility]

## 2021-05-05 NOTE — Progress Notes (Signed)
[place] 20+6 wks,cephalic,anterior placenta gr 0,normal ovaries,cx 3.4 cm,FHR 130 bpm,SVP of fluid 3.5 cm,EFW 383 g 45%,anatomy complete,no obvious abnormalities

## 2021-06-02 ENCOUNTER — Encounter: Payer: Self-pay | Admitting: Advanced Practice Midwife

## 2021-06-02 ENCOUNTER — Ambulatory Visit (INDEPENDENT_AMBULATORY_CARE_PROVIDER_SITE_OTHER): Payer: PRIVATE HEALTH INSURANCE | Admitting: Advanced Practice Midwife

## 2021-06-02 ENCOUNTER — Other Ambulatory Visit: Payer: Self-pay

## 2021-06-02 VITALS — BP 111/68 | HR 93 | Wt 202.0 lb

## 2021-06-02 DIAGNOSIS — Z3A24 24 weeks gestation of pregnancy: Secondary | ICD-10-CM

## 2021-06-02 DIAGNOSIS — Z3480 Encounter for supervision of other normal pregnancy, unspecified trimester: Secondary | ICD-10-CM

## 2021-06-02 NOTE — Progress Notes (Signed)
° °  LOW-RISK PREGNANCY VISIT Patient name: Miranda Campbell MRN [id number]  Date of birth: January 20, 1996 Chief Complaint:   Routine Prenatal Visit  History of Present Illness:   Miranda Campbell is a 25 y.o. G55P1001 female at [redacted]w[redacted]d with an Estimated Date of Delivery: 09/16/21 being seen today for ongoing management of a low-risk pregnancy.  Today she reports no complaints. Contractions: Not present. Vag. Bleeding: None.  Movement: Present. denies leaking of fluid. Review of Systems:   Pertinent items are noted in HPI Denies abnormal vaginal discharge w/ itching/odor/irritation, headaches, visual changes, shortness of breath, chest pain, abdominal pain, severe nausea/vomiting, or problems with urination or bowel movements unless otherwise stated above. Pertinent History Reviewed:  Reviewed past medical,surgical, social, obstetrical and family history.  Reviewed problem list, medications and allergies. Physical Assessment:   Vitals:   06/02/21 0920  BP: 111/68  Pulse: 93  Weight: 202 lb (91.6 kg)  Body mass index is 35.78 kg/m.        Physical Examination:   General appearance: Well appearing, and in no distress  Mental status: Alert, oriented to person, place, and time  Skin: Warm & dry  Cardiovascular: Normal heart rate noted  Respiratory: Normal respiratory effort, no distress  Abdomen: Soft, gravid, nontender  Pelvic: Cervical exam deferred         Extremities: Edema: None  Fetal Status:     Movement: Present    Chaperone: [place]    No results found for this or any previous visit (from the past 24 hour(s)).  Assessment & Plan:  1) Low-risk pregnancy G2P1001 at [redacted]w[redacted]d with an Estimated Date of Delivery: 09/16/21      Meds: No orders of the defined types were placed in this encounter.  Labs/procedures today: none  Plan:  Continue routine obstetrical care  Next visit: prefers will be in person for PN2     Reviewed: Preterm labor symptoms and general obstetric  precautions including but not limited to vaginal bleeding, contractions, leaking of fluid and fetal movement were reviewed in detail with the patient.  All questions were answered. Has home bp cuff. Check bp weekly, let us know if >140/90.   Follow-up: No follow-ups on file.  No orders of the defined types were placed in this encounter.  Jacklyn Shell DNP, CNM 06/02/2021 9:52 AM

## 2021-06-02 NOTE — Patient Instructions (Signed)

## 2021-06-05 NOTE — L&D Delivery Note (Addendum)
OB/GYN Faculty Practice Delivery Note ? ?Miranda Campbell is a 26 y.o. G2P1001 s/p SVD at [redacted]w[redacted]d. She was admitted for SOL.  ? ?ROM: 1h 48m with clear fluid ?GBS Status: Negative ?Maximum Maternal Temperature: 98.54F ? ?Labor Progress: ?Patient arrived for SOL at 6cm dilated and then [facility] and was expectantly managed. She ultimately progressed to complete. ? ?Delivery Date/Time: 09/21/2021 at 10:07 ?Delivery: Called to room and patient was complete and pushing. Head delivered . No nuchal cord present. Shoulder and body delivered in usual fashion. Infant with spontaneous cry, placed on mother's abdomen, dried and stimulated. Cord clamped x 2 after 1-minute delay, and cut by FOB under my direct supervision. Cord blood drawn. Placenta delivered spontaneously with gentle cord traction. Fundus firm with massage and Pitocin. Labia, perineum, vagina, and cervix were inspected, with no lacerations.  ? ?Placenta: complete, three vessel cord appreciated ?Complications: None ?Lacerations: None  ?EBL: 100 mL ?Analgesia: Epidural ? ?Infant: Girl  APGARs 8 and 9, weight pending. ? ?Alen Bleacher, MD ?Center for [place], [place]  ? ?GME ATTESTATION:  ?I saw and evaluated the patient. I agree with the findings and the plan of care as documented in the resident?s note. I was gloved and present for entire delivery.  ? ?Renard Matter, MD, MPH ?OB Fellow, Faculty Practice ?[place] for [facility] ?09/21/2021 11:41 AM ? ? ?  ?

## 2021-06-20 ENCOUNTER — Other Ambulatory Visit: Payer: Self-pay

## 2021-06-20 ENCOUNTER — Ambulatory Visit (INDEPENDENT_AMBULATORY_CARE_PROVIDER_SITE_OTHER): Payer: 59 | Admitting: Obstetrics & Gynecology

## 2021-06-20 ENCOUNTER — Encounter: Payer: Self-pay | Admitting: Obstetrics & Gynecology

## 2021-06-20 ENCOUNTER — Other Ambulatory Visit: Payer: 59

## 2021-06-20 VITALS — BP 131/77 | HR 83 | Wt 205.0 lb

## 2021-06-20 DIAGNOSIS — Z131 Encounter for screening for diabetes mellitus: Secondary | ICD-10-CM

## 2021-06-20 DIAGNOSIS — Z3483 Encounter for supervision of other normal pregnancy, third trimester: Secondary | ICD-10-CM

## 2021-06-20 DIAGNOSIS — Z3A27 27 weeks gestation of pregnancy: Secondary | ICD-10-CM

## 2021-06-20 DIAGNOSIS — Z23 Encounter for immunization: Secondary | ICD-10-CM | POA: Diagnosis not present

## 2021-06-20 DIAGNOSIS — Z3480 Encounter for supervision of other normal pregnancy, unspecified trimester: Secondary | ICD-10-CM

## 2021-06-20 NOTE — Progress Notes (Signed)
° ° °  LOW-RISK PREGNANCY VISIT Patient name: Miranda Campbell MRN [id number]  Date of birth: March 04, 1996 Chief Complaint:   Routine Prenatal Visit  History of Present Illness:   Miranda Campbell is a 26 y.o. G36P1001 female at [redacted]w[redacted]d with an Estimated Date of Delivery: 09/16/21 being seen today for ongoing management of a low-risk pregnancy.  Depression screen [facility] 2/9 06/20/2021 03/04/2021 01/28/2018  Decreased Interest 1 1 3   Down, Depressed, Hopeless 1 0 1  PHQ - 2 Score 2 1 4   Altered sleeping 2 0 0  Tired, decreased energy 2 2 2   Change in appetite 1 2 2   Feeling bad or failure about yourself  0 0 0  Trouble concentrating 0 0 0  Moving slowly or fidgety/restless 0 0 0  Suicidal thoughts 0 0 0  PHQ-9 Score 7 5 8   Difficult doing work/chores - - Not difficult at all    Today she reports no complaints. Contractions: Not present. Vag. Bleeding: None.  Movement: Present. denies leaking of fluid. Review of Systems:   Pertinent items are noted in HPI Denies abnormal vaginal discharge w/ itching/odor/irritation, headaches, visual changes, shortness of breath, chest pain, abdominal pain, severe nausea/vomiting, or problems with urination or bowel movements unless otherwise stated above. Pertinent History Reviewed:  Reviewed past medical,surgical, social, obstetrical and family history.  Reviewed problem list, medications and allergies. Physical Assessment:   Vitals:   06/20/21 0902  BP: 131/77  Pulse: 83  Weight: 205 lb (93 kg)  Body mass index is 36.31 kg/m.        Physical Examination:   General appearance: Well appearing, and in no distress  Mental status: Alert, oriented to person, place, and time  Skin: Warm & dry  Cardiovascular: Normal heart rate noted  Respiratory: Normal respiratory effort, no distress  Abdomen: Soft, gravid, nontender  Pelvic: Cervical exam deferred         Extremities: Edema: None  Fetal Status: Fetal Heart Rate (bpm): 145 Fundal Height: 27 cm Movement:  Present    Chaperone: n/a    No results found for this or any previous visit (from the past 24 hour(s)).  Assessment & Plan:  1) Low-risk pregnancy G2P1001 at [redacted]w[redacted]d with an Estimated Date of Delivery: 09/16/21      Meds: No orders of the defined types were placed in this encounter.  Labs/procedures today: PN2  Plan:  Continue routine obstetrical care  Next visit: prefers in person    Reviewed: Preterm labor symptoms and general obstetric precautions including but not limited to vaginal bleeding, contractions, leaking of fluid and fetal movement were reviewed in detail with the patient.  All questions were answered. Has home bp cuff. Rx faxed to . Check bp weekly, let know if >140/90.   Follow-up: Return in about 4 weeks (around 07/18/2021) for LROB.  Orders Placed This Encounter  Procedures   Tdap vaccine greater than or equal to 7yo IM    06/22/21, MD 06/20/2021 9:53 AM

## 2021-06-21 LAB — RPR: RPR Ser Ql: NONREACTIVE

## 2021-06-21 LAB — CBC
Hematocrit: 36.4 % (ref 34.0–46.6)
Hemoglobin: 12.5 g/dL (ref 11.1–15.9)
MCH: 31.7 pg (ref 26.6–33.0)
MCHC: 34.3 g/dL (ref 31.5–35.7)
MCV: 92 fL (ref 79–97)
Platelets: 244 10*3/uL (ref 150–450)
RBC: 3.94 x10E6/uL (ref 3.77–5.28)
RDW: 12.4 % (ref 11.7–15.4)
WBC: 9.8 10*3/uL (ref 3.4–10.8)

## 2021-06-21 LAB — GLUCOSE TOLERANCE, 2 HOURS W/ 1HR
Glucose, 1 hour: 131 mg/dL (ref 70–179)
Glucose, 2 hour: 109 mg/dL (ref 70–152)
Glucose, Fasting: 85 mg/dL (ref 70–91)

## 2021-06-21 LAB — HIV ANTIBODY (ROUTINE TESTING W REFLEX): HIV Screen 4th Generation wRfx: NONREACTIVE

## 2021-06-21 LAB — ANTIBODY SCREEN: Antibody Screen: NEGATIVE

## 2021-07-18 ENCOUNTER — Other Ambulatory Visit: Payer: Self-pay

## 2021-07-18 ENCOUNTER — Encounter: Payer: Self-pay | Admitting: Obstetrics & Gynecology

## 2021-07-18 ENCOUNTER — Ambulatory Visit (INDEPENDENT_AMBULATORY_CARE_PROVIDER_SITE_OTHER): Payer: 59 | Admitting: Obstetrics & Gynecology

## 2021-07-18 VITALS — BP 124/63 | HR 86 | Wt 207.0 lb

## 2021-07-18 DIAGNOSIS — Z3480 Encounter for supervision of other normal pregnancy, unspecified trimester: Secondary | ICD-10-CM

## 2021-07-18 NOTE — Progress Notes (Signed)
° °  LOW-RISK PREGNANCY VISIT Patient name: Miranda Campbell MRN [id number]  Date of birth: 05-03-1996 Chief Complaint:   Routine Prenatal Visit  History of Present Illness:   Miranda Campbell is a 26 y.o. G68P1001 female at [redacted]w[redacted]d with an Estimated Date of Delivery: 09/16/21 being seen today for ongoing management of a low-risk pregnancy.  Depression screen [facility] 2/9 06/20/2021 03/04/2021 01/28/2018  Decreased Interest 1 1 3   Down, Depressed, Hopeless 1 0 1  PHQ - 2 Score 2 1 4   Altered sleeping 2 0 0  Tired, decreased energy 2 2 2   Change in appetite 1 2 2   Feeling bad or failure about yourself  0 0 0  Trouble concentrating 0 0 0  Moving slowly or fidgety/restless 0 0 0  Suicidal thoughts 0 0 0  PHQ-9 Score 7 5 8   Difficult doing work/chores - - Not difficult at all    Today she reports no complaints. Contractions: Not present. Vag. Bleeding: None.  Movement: Present. denies leaking of fluid. Review of Systems:   Pertinent items are noted in HPI Denies abnormal vaginal discharge w/ itching/odor/irritation, headaches, visual changes, shortness of breath, chest pain, abdominal pain, severe nausea/vomiting, or problems with urination or bowel movements unless otherwise stated above. Pertinent History Reviewed:  Reviewed past medical,surgical, social, obstetrical and family history.  Reviewed problem list, medications and allergies. Physical Assessment:   Vitals:   07/18/21 0947  BP: 124/63  Pulse: 86  Weight: 207 lb (93.9 kg)  Body mass index is 36.67 kg/m.        Physical Examination:   General appearance: Well appearing, and in no distress  Mental status: Alert, oriented to person, place, and time  Skin: Warm & dry  Cardiovascular: Normal heart rate noted  Respiratory: Normal respiratory effort, no distress  Abdomen: Soft, gravid, nontender  Pelvic: Cervical exam deferred         Extremities: Edema: None  Fetal Status: Fetal Heart Rate (bpm): 142 Fundal Height: 31 cm Movement:  Present    Chaperone: n/a    No results found for this or any previous visit (from the past 24 hour(s)).  Assessment & Plan:  1) Low-risk pregnancy G2P1001 at [redacted]w[redacted]d with an Estimated Date of Delivery: 09/16/21   2) ,    Meds: No orders of the defined types were placed in this encounter.  Labs/procedures today:   Plan:  Continue routine obstetrical care  Next visit: prefers in person    Reviewed: Preterm labor symptoms and general obstetric precautions including but not limited to vaginal bleeding, contractions, leaking of fluid and fetal movement were reviewed in detail with the patient.  All questions were answered. Has home bp cuff. Rx faxed to . Check bp weekly, let know if >140/90.   Follow-up: Return in about 3 weeks (around 08/08/2021) for LROB.  No orders of the defined types were placed in this encounter.   07/20/21, MD 07/18/2021 9:55 AM

## 2021-08-08 ENCOUNTER — Ambulatory Visit (INDEPENDENT_AMBULATORY_CARE_PROVIDER_SITE_OTHER): Payer: 59 | Admitting: Women's Health

## 2021-08-08 ENCOUNTER — Other Ambulatory Visit: Payer: Self-pay

## 2021-08-08 ENCOUNTER — Encounter: Payer: Self-pay | Admitting: Women's Health

## 2021-08-08 VITALS — BP 119/75 | HR 88 | Wt 208.0 lb

## 2021-08-08 DIAGNOSIS — Z3483 Encounter for supervision of other normal pregnancy, third trimester: Secondary | ICD-10-CM

## 2021-08-08 DIAGNOSIS — Z3480 Encounter for supervision of other normal pregnancy, unspecified trimester: Secondary | ICD-10-CM

## 2021-08-08 NOTE — Progress Notes (Signed)
Patient stated that baby has been moving well and she is feeling fine, have no questions or concerns today ? ?Tereka Thorley, CMA  ?

## 2021-08-08 NOTE — Progress Notes (Signed)
? ? ?  LOW-RISK PREGNANCY VISIT ?Patient name: Miranda Campbell MRN [id number]  Date of birth: 16-Sep-1995 ?Chief Complaint:   ?Routine Prenatal Visit ? ?History of Present Illness:   ?Miranda Campbell is a 26 y.o. G6P1001 female at [redacted]w[redacted]d with an Estimated Date of Delivery: 09/16/21 being seen today for ongoing management of a low-risk pregnancy.  ? ?Today she reports no complaints. Contractions: Not present. Vag. Bleeding: None.  Movement: Present. denies leaking of fluid. ? ?Depression screen [facility] 2/9 06/20/2021 03/04/2021 01/28/2018  ?Decreased Interest 1 1 3   ?Down, Depressed, Hopeless 1 0 1  ?PHQ - 2 Score 2 1 4   ?Altered sleeping 2 0 0  ?Tired, decreased energy 2 2 2   ?Change in appetite 1 2 2   ?Feeling bad or failure about yourself  0 0 0  ?Trouble concentrating 0 0 0  ?Moving slowly or fidgety/restless 0 0 0  ?Suicidal thoughts 0 0 0  ?PHQ-9 Score 7 5 8   ?Difficult doing work/chores - - Not difficult at all  ? ?  ?GAD 7 : Generalized Anxiety Score 06/20/2021 03/04/2021  ?Nervous, Anxious, on Edge 1 2  ?Control/stop worrying 0 1  ?Worry too much - different things 1 2  ?Trouble relaxing 1 1  ?Restless 0 1  ?Easily annoyed or irritable 1 2  ?Afraid - awful might happen 1 2  ?Total GAD 7 Score 5 11  ? ? ?  ?Review of Systems:   ?Pertinent items are noted in HPI ?Denies abnormal vaginal discharge w/ itching/odor/irritation, headaches, visual changes, shortness of breath, chest pain, abdominal pain, severe nausea/vomiting, or problems with urination or bowel movements unless otherwise stated above. ?Pertinent History Reviewed:  ?Reviewed past medical,surgical, social, obstetrical and family history.  ?Reviewed problem list, medications and allergies. ?Physical Assessment:  ? ?Vitals:  ? 08/08/21 0834  ?BP: 119/75  ?Pulse: 88  ?Weight: 208 lb (94.3 kg)  ?Body mass index is 36.85 kg/m?. ?  ?     Physical Examination:  ? General appearance: Well appearing, and in no distress ? Mental status: Alert, oriented to person, place, and  time ? Skin: Warm & dry ? Cardiovascular: Normal heart rate noted ? Respiratory: Normal respiratory effort, no distress ? Abdomen: Soft, gravid, nontender ? Pelvic: Cervical exam deferred        ? Extremities: Edema: None ? ?Fetal Status: Fetal Heart Rate (bpm): 140 Fundal Height: 34 cm Movement: Present   ? ?Chaperone: N/A   ?No results found for this or any previous visit (from the past 24 hour(s)).  ?Assessment & Plan:  ?1) Low-risk pregnancy G2P1001 at [redacted]w[redacted]d with an Estimated Date of Delivery: 09/16/21  ?  ?Meds: No orders of the defined types were placed in this encounter. ? ?Labs/procedures today: none ? ?Plan:  Continue routine obstetrical care  ?Next visit: prefers will be in person for cultures    ? ?Reviewed: Preterm labor symptoms and general obstetric precautions including but not limited to vaginal bleeding, contractions, leaking of fluid and fetal movement were reviewed in detail with the patient.  All questions were answered. Does have home bp cuff. Office bp cuff given: not applicable. Check bp weekly, let 06/22/2021 know if consistently >140 and/or >90. ? ?Follow-up: Return in about 2 weeks (around 08/22/2021) for LROB, CNM, in person then weekly LROB w/ CNM in person. ? ?No future appointments. ? ?No orders of the defined types were placed in this encounter. ? ?10/08/21 CNM, WHNP-BC ?08/08/2021 ?8:53 AM  ?

## 2021-08-08 NOTE — Patient Instructions (Signed)
Miranda Campbell, thank you for choosing our office today! We appreciate the opportunity to meet your healthcare needs. You may receive a short survey by mail, e-mail, or through [employer]. If you are happy with your care we would appreciate if you could take just a few minutes to complete the survey questions. We read all of your comments and take your feedback very seriously. Thank you again for choosing our office.  Center for [employer] Team at [facility]  [facility] at [facility] ([address] [place], [place] [address]) Entrance C, located off of [address] Free 24/7 valet parking   CLASSES: Go to [email or web address] to register for classes (childbirth, breastfeeding, waterbirth, infant CPR, daddy bootcamp, etc.)  Call the office [phone]) or go to [facility] if: You begin to have strong, frequent contractions Your water breaks.  Sometimes it is a big gush of fluid, sometimes it is just a trickle that keeps getting your panties wet or running down your legs You have vaginal bleeding.  It is normal to have a small amount of spotting if your cervix was checked.  You don't feel your baby moving like normal.  If you don't, get you something to eat and drink and lay down and focus on feeling your baby move.   If your baby is still not moving like normal, you should call the office or go to [facility].  Call the office [phone]) or go to [facility] hospital for these signs of pre-eclampsia: Severe headache that does not go away with Tylenol Visual changes- seeing spots, double, blurred vision Pain under your right breast or upper abdomen that does not go away with Tums or heartburn medicine Nausea and/or vomiting Severe swelling in your hands, feet, and face   Tdap Vaccine It is recommended that you get the Tdap vaccine during the third trimester of EACH pregnancy to help protect your baby from getting pertussis (whooping cough) 27-36 weeks is the BEST time to do  this so that you can pass the protection on to your baby. During pregnancy is better than after pregnancy, but if you are unable to get it during pregnancy it will be offered at the hospital.  You can get this vaccine with [place], at the health department, your family doctor, or some local pharmacies Everyone who will be around your baby should also be up-to-date on their vaccines before the baby comes. Adults (who are not pregnant) only need 1 dose of Tdap during adulthood.   [facility] Pediatricians/Family Doctors [facility]): [address] [place]lette Ribas, [phone]           [facility]: [address], [phone]                [facility]): [address], [phone] (call to ask if accepting patients) [facility] Department: [address], [place], [phone]    [facility] Pediatricians/Family Doctors [facility]): [phone] [address], [phone] [facility]: [address] [place], [phone] [facility]: [address], [phone]  [facility] Doctors  [facility]): [phone] [facility]: [address], [phone]   [facility] Doctors [facility]: [address], [phone]  [facility] Family Doctors  [facility]: [phone], [phone]  Home Blood Pressure Monitoring for Patients   Your provider has recommended that you check your  blood pressure (BP) at least once a week at home. If you do not have a blood pressure cuff at home, one will be provided for you. Contact your provider if you have not received your monitor within 1 week.  ° °Helpful Tips for Accurate Home Blood Pressure Checks  °Don't smoke, exercise, or drink caffeine 30 minutes before checking your BP °Use the restroom before checking your BP (a full bladder can raise your  pressure) °Relax in a comfortable upright chair °Feet on the ground °Left arm resting comfortably on a flat surface at the level of your heart °Legs uncrossed °Back supported °Sit quietly and don't talk °Place the cuff on your bare arm °Adjust snuggly, so that only two fingertips can fit between your skin and the top of the cuff °Check 2 readings separated by at least one minute °Keep a log of your BP readings °For a visual, please reference this diagram: http://ccnc.care/bpdiagram ° °Provider Name: [facility]     Phone: [phone] ° °Zone 1: ALL CLEAR  °Continue to monitor your symptoms:  °BP reading is less than 140 (top number) or less than 90 (bottom number)  °No right upper stomach pain °No headaches or seeing spots °No feeling nauseated or throwing up °No swelling in face and hands ° °Zone 2: CAUTION °Call your doctor's office for any of the following:  °BP reading is greater than 140 (top number) or greater than 90 (bottom number)  °Stomach pain under your ribs in the middle or right side °Headaches or seeing spots °Feeling nauseated or throwing up °Swelling in face and hands ° °Zone 3: EMERGENCY  °Seek immediate medical care if you have any of the following:  °BP reading is greater than160 (top number) or greater than 110 (bottom number) °Severe headaches not improving with Tylenol °Serious difficulty catching your breath °Any worsening symptoms from Zone 2  °Preterm Labor and Birth Information ° °The normal length of a pregnancy is 39-41 weeks. Preterm labor is when labor starts before 37 completed weeks of pregnancy. °What are the risk factors for preterm labor? °Preterm labor is more likely to occur in women who: °Have certain infections during pregnancy such as a bladder infection, sexually transmitted infection, or infection inside the uterus (chorioamnionitis). °Have a shorter-than-normal cervix. °Have gone into preterm labor before. °Have had surgery on their cervix. °Are younger than age 17  or older than age 35. °Are African American. °Are pregnant with twins or multiple babies (multiple gestation). °Take street drugs or smoke while pregnant. °Do not gain enough weight while pregnant. °Became pregnant shortly after having been pregnant. °What are the symptoms of preterm labor? °Symptoms of preterm labor include: °Cramps similar to those that can happen during a menstrual period. The cramps may happen with diarrhea. °Pain in the abdomen or lower back. °Regular uterine contractions that may feel like tightening of the abdomen. °A feeling of increased pressure in the pelvis. °Increased watery or bloody mucus discharge from the vagina. °Water breaking (ruptured amniotic sac). °Why is it important to recognize signs of preterm labor? °It is important to recognize signs of preterm labor because babies who are born prematurely may not be fully developed. This can put them at an increased risk for: °Long-term (chronic) heart and lung problems. °Difficulty immediately after birth with regulating body systems, including blood sugar, body temperature, heart rate, and breathing rate. °Bleeding in the brain. °Cerebral palsy. °Learning difficulties. °Death. °These risks are highest for babies who are born before 34 weeks   of pregnancy. How is preterm labor treated? Treatment depends on the length of your pregnancy, your condition, and the health of your baby. It may involve: Having a stitch (suture) placed in your cervix to prevent your cervix from opening too early (cerclage). Taking or being given medicines, such as: Hormone medicines. These may be given early in pregnancy to help support the pregnancy. Medicine to stop contractions. Medicines to help mature the babys lungs. These may be prescribed if the risk of delivery is high. Medicines to prevent your baby from developing cerebral palsy. If the labor happens before 34 weeks of pregnancy, you may need to stay in the hospital. What should I do if I  think I am in preterm labor? If you think that you are going into preterm labor, call your health care provider right away. How can I prevent preterm labor in future pregnancies? To increase your chance of having a full-term pregnancy: Do not use any tobacco products, such as cigarettes, chewing tobacco, and e-cigarettes. If you need help quitting, ask your health care provider. Do not use street drugs or medicines that have not been prescribed to you during your pregnancy. Talk with your health care provider before taking any herbal supplements, even if you have been taking them regularly. Make sure you gain a healthy amount of weight during your pregnancy. Watch for infection. If you think that you might have an infection, get it checked right away. Make sure to tell your health care provider if you have gone into preterm labor before. This information is not intended to replace advice given to you by your health care provider. Make sure you discuss any questions you have with your health care provider. Document Revised: 09/13/2018 Document Reviewed: 10/13/2015 Elsevier Patient Education  [place].

## 2021-08-22 ENCOUNTER — Other Ambulatory Visit (HOSPITAL_COMMUNITY)
Admission: RE | Admit: 2021-08-22 | Discharge: 2021-08-22 | Disposition: A | Discharge status: Home / Self Care | Payer: 59 | Source: Ambulatory Visit | Attending: Women's Health | Admitting: Women's Health

## 2021-08-22 ENCOUNTER — Other Ambulatory Visit: Payer: Self-pay

## 2021-08-22 ENCOUNTER — Ambulatory Visit (INDEPENDENT_AMBULATORY_CARE_PROVIDER_SITE_OTHER): Payer: 59 | Admitting: Women's Health

## 2021-08-22 ENCOUNTER — Encounter: Payer: Self-pay | Admitting: Women's Health

## 2021-08-22 VITALS — BP 105/73 | HR 87 | Wt 213.0 lb

## 2021-08-22 DIAGNOSIS — Z3480 Encounter for supervision of other normal pregnancy, unspecified trimester: Secondary | ICD-10-CM

## 2021-08-22 DIAGNOSIS — Z3A36 36 weeks gestation of pregnancy: Secondary | ICD-10-CM | POA: Insufficient documentation

## 2021-08-22 DIAGNOSIS — Z3483 Encounter for supervision of other normal pregnancy, third trimester: Secondary | ICD-10-CM

## 2021-08-22 NOTE — Progress Notes (Signed)
? ? ?LOW-RISK PREGNANCY VISIT ?Patient name: Miranda Campbell MRN [id number]  Date of birth: Sep 21, 1995 ?Chief Complaint:   ?Routine Prenatal Visit (GBS, GC/CHL) ? ?History of Present Illness:   ?Miranda Campbell is a 26 y.o. G42P1001 female at [redacted]w[redacted]d with an Estimated Date of Delivery: 09/16/21 being seen today for ongoing management of a low-risk pregnancy.  ? ?Today she reports no complaints. Contractions: Not present. Vag. Bleeding: None.  Movement: Present. denies leaking of fluid. ? ?Depression screen [facility] 2/9 06/20/2021 03/04/2021 01/28/2018  ?Decreased Interest 1 1 3   ?Down, Depressed, Hopeless 1 0 1  ?PHQ - 2 Score 2 1 4   ?Altered sleeping 2 0 0  ?Tired, decreased energy 2 2 2   ?Change in appetite 1 2 2   ?Feeling bad or failure about yourself  0 0 0  ?Trouble concentrating 0 0 0  ?Moving slowly or fidgety/restless 0 0 0  ?Suicidal thoughts 0 0 0  ?PHQ-9 Score 7 5 8   ?Difficult doing work/chores - - Not difficult at all  ? ?  ?GAD 7 : Generalized Anxiety Score 06/20/2021 03/04/2021  ?Nervous, Anxious, on Edge 1 2  ?Control/stop worrying 0 1  ?Worry too much - different things 1 2  ?Trouble relaxing 1 1  ?Restless 0 1  ?Easily annoyed or irritable 1 2  ?Afraid - awful might happen 1 2  ?Total GAD 7 Score 5 11  ? ? ?  ?Review of Systems:   ?Pertinent items are noted in HPI ?Denies abnormal vaginal discharge w/ itching/odor/irritation, headaches, visual changes, shortness of breath, chest pain, abdominal pain, severe nausea/vomiting, or problems with urination or bowel movements unless otherwise stated above. ?Pertinent History Reviewed:  ?Reviewed past medical,surgical, social, obstetrical and family history.  ?Reviewed problem list, medications and allergies. ?Physical Assessment:  ? ?Vitals:  ? 08/22/21 0841  ?BP: 105/73  ?Pulse: 87  ?Weight: 213 lb (96.6 kg)  ?Body mass index is 37.73 kg/m?. ?  ?     Physical Examination:  ? General appearance: Well appearing, and in no distress ? Mental status: Alert, oriented to  person, place, and time ? Skin: Warm & dry ? Cardiovascular: Normal heart rate noted ? Respiratory: Normal respiratory effort, no distress ? Abdomen: Soft, gravid, nontender ? Pelvic: Cervical exam performed  Dilation: 1.5 Effacement (%): 50 Station: -2 ? Extremities: Edema: None ? ?Fetal Status: Fetal Heart Rate (bpm): 141 Fundal Height: 36 cm Movement: Present Presentation: Vertex ? ?Chaperone:   ?No results found for this or any previous visit (from the past 24 hour(s)).  ?Assessment & Plan:  ?1) Low-risk pregnancy G2P1001 at [redacted]w[redacted]d with an Estimated Date of Delivery: 09/16/21  ?  ?Meds: No orders of the defined types were placed in this encounter. ? ?Labs/procedures today: GBS, GC/CT, and SVE ? ?Plan:  Continue routine obstetrical care  ?Next visit: prefers in person   ? ?Reviewed: Preterm labor symptoms and general obstetric precautions including but not limited to vaginal bleeding, contractions, leaking of fluid and fetal movement were reviewed in detail with the patient.  All questions were answered. Does have home bp cuff. Office bp cuff given: not applicable. Check bp weekly, let 03/06/2021 know if consistently >140 and/or >90. ? ?Follow-up: Return for As scheduled. ? ?Future Appointments  ?Date Time Provider Department Center  ?08/29/2021 10:30 AM Malachy Mood, CNM [facility]  ?09/05/2021  8:30 AM 09/18/21, CNM [facility]  ?09/12/2021  9:10 AM 08/31/2021, DO [facility]  ? ? ?Orders Placed  This Encounter  ?Procedures  ? Strep Gp B NAA  ? ?Cheral Marker CNM, WHNP-BC ?08/22/2021 ?9:02 AM  ?

## 2021-08-22 NOTE — Patient Instructions (Signed)
Miranda Campbell, thank you for choosing our office today! We appreciate the opportunity to meet your healthcare needs. You may receive a short survey by mail, e-mail, or through [employer]. If you are happy with your care we would appreciate if you could take just a few minutes to complete the survey questions. We read all of your comments and take your feedback very seriously. Thank you again for choosing our office.  ?Center for [employer] Team at [facility] ? ?Women's & [place] at [facility] ?([address] [place], [place] [address]) ?Entrance C, located off of [address] ?Free 24/7 valet parking  ? ?CLASSES: Go to [email or web address] to register for classes (childbirth, breastfeeding, waterbirth, infant CPR, daddy bootcamp, etc.) ? ?Call the office [phone]) or go to [facility] if: ?You begin to have strong, frequent contractions ?Your water breaks.  Sometimes it is a big gush of fluid, sometimes it is just a trickle that keeps getting your panties wet or running down your legs ?You have vaginal bleeding.  It is normal to have a small amount of spotting if your cervix was checked.  ?You don't feel your baby moving like normal.  If you don't, get you something to eat and drink and lay down and focus on feeling your baby move.   If your baby is still not moving like normal, you should call the office or go to [facility]. ? ?Call the office [phone]) or go to [facility] hospital for these signs of pre-eclampsia: ?Severe headache that does not go away with Tylenol ?Visual changes- seeing spots, double, blurred vision ?Pain under your right breast or upper abdomen that does not go away with Tums or heartburn medicine ?Nausea and/or vomiting ?Severe swelling in your hands, feet, and face  ? ?Tdap Vaccine ?It is recommended that you get the Tdap vaccine during the third trimester of EACH pregnancy to help protect your baby from getting pertussis (whooping cough) ?27-36 weeks is the BEST time to do  this so that you can pass the protection on to your baby. During pregnancy is better than after pregnancy, but if you are unable to get it during pregnancy it will be offered at the hospital.  ?You can get this vaccine with [place], at the health department, your family doctor, or some local pharmacies ?Everyone who will be around your baby should also be up-to-date on their vaccines before the baby comes. Adults (who are not pregnant) only need 1 dose of Tdap during adulthood.  ? ?[place] Pediatricians/Family Doctors ?[facility]): [address] [place], [phone]           ?[facility]: [address], [phone]                ?[facility]): [place], [phone] (call to ask if accepting patients) ?[place] Department: [place] [address], [place], [place]   ? ?[place] Pediatricians/Family Doctors ?[facility]): [address], [phone] ?[place]: [address] [place], [phone] ?[facility]: [place], [phone] ? ?[place]  ?[facility]): [phone] ?[facility]: [place], [phone]  ? ?[place] ?[place]: [place] [address] [phone] ? ?[place]  ?[facility]: [phone], [phone] ? ?Home Blood Pressure Monitoring for Patients  ? ?Your provider has recommended that you check your  blood pressure (BP) at least once a week at home. If you do not have a blood pressure cuff at home, one will be provided for you. Contact your provider if you have not received your monitor within 1 week.  ? ?Helpful Tips for Accurate Home Blood Pressure Checks  ?Don't smoke, exercise, or drink caffeine 30 minutes before checking your BP ?Use the restroom before checking your BP (a full bladder can raise your  pressure) ?Relax in a comfortable upright chair ?Feet on the ground ?Left arm resting comfortably on a flat surface at the level of your heart ?Legs uncrossed ?Back supported ?Sit quietly and don't talk ?Place the cuff on your bare arm ?Adjust snuggly, so that only two fingertips can fit between your skin and the top of the cuff ?Check 2 readings separated by at least one minute ?Keep a log of your BP readings ?For a visual, please reference this diagram: http://ccnc.care/bpdiagram ? ?Provider Name: [facility]     Phone: [phone] ? ?Zone 1: ALL CLEAR  ?Continue to monitor your symptoms:  ?BP reading is less than 140 (top number) or less than 90 (bottom number)  ?No right upper stomach pain ?No headaches or seeing spots ?No feeling nauseated or throwing up ?No swelling in face and hands ? ?Zone 2: CAUTION ?Call your doctor's office for any of the following:  ?BP reading is greater than 140 (top number) or greater than 90 (bottom number)  ?Stomach pain under your ribs in the middle or right side ?Headaches or seeing spots ?Feeling nauseated or throwing up ?Swelling in face and hands ? ?Zone 3: EMERGENCY  ?Seek immediate medical care if you have any of the following:  ?BP reading is greater than160 (top number) or greater than 110 (bottom number) ?Severe headaches not improving with Tylenol ?Serious difficulty catching your breath ?Any worsening symptoms from Zone 2  ?Preterm Labor and Birth Information ? ?The normal length of a pregnancy is 39-41 weeks. Preterm labor is when labor starts before 37 completed weeks of pregnancy. ?What are the risk factors for preterm labor? ?Preterm labor is more likely to occur in women who: ?Have certain infections during pregnancy such as a bladder infection, sexually transmitted infection, or infection inside the uterus (chorioamnionitis). ?Have a shorter-than-normal cervix. ?Have gone into preterm labor before. ?Have had surgery on their cervix. ?Are younger than age 72  or older than age 108. ?Are African American. ?Are pregnant with twins or multiple babies (multiple gestation). ?Take street drugs or smoke while pregnant. ?Do not gain enough weight while pregnant. ?Became pregnant shortly after having been pregnant. ?What are the symptoms of preterm labor? ?Symptoms of preterm labor include: ?Cramps similar to those that can happen during a menstrual period. The cramps may happen with diarrhea. ?Pain in the abdomen or lower back. ?Regular uterine contractions that may feel like tightening of the abdomen. ?A feeling of increased pressure in the pelvis. ?Increased watery or bloody mucus discharge from the vagina. ?Water breaking (ruptured amniotic sac). ?Why is it important to recognize signs of preterm labor? ?It is important to recognize signs of preterm labor because babies who are born prematurely may not be fully developed. This can put them at an increased risk for: ?Long-term (chronic) heart and lung problems. ?Difficulty immediately after birth with regulating body systems, including blood sugar, body temperature, heart rate, and breathing rate. ?Bleeding in the brain. ?Cerebral palsy. ?Learning difficulties. ?Death. ?These risks are highest for babies who are born before 11 weeks  of pregnancy. ?How is preterm labor treated? ?Treatment depends on the length of your pregnancy, your condition, and the health of your baby. It may involve: ?Having a stitch (suture) placed in your cervix to prevent your cervix from opening too early (cerclage). ?Taking or being given medicines, such as: ?Hormone medicines. These may be given early in pregnancy to help support the pregnancy. ?Medicine to stop contractions. ?Medicines to help mature the baby?s lungs. These may be prescribed if the risk of delivery is high. ?Medicines to prevent your baby from developing cerebral palsy. ?If the labor happens before 34 weeks of pregnancy, you may need to stay in the hospital. ?What should I do if I  think I am in preterm labor? ?If you think that you are going into preterm labor, call your health care provider right away. ?How can I prevent preterm labor in future pregnancies? ?To increase your chance o

## 2021-08-23 LAB — CERVICOVAGINAL ANCILLARY ONLY
Chlamydia: NEGATIVE
Comment: NEGATIVE
Comment: NORMAL
Neisseria Gonorrhea: NEGATIVE

## 2021-08-24 LAB — STREP GP B NAA: Strep Gp B NAA: NEGATIVE

## 2021-08-29 ENCOUNTER — Other Ambulatory Visit: Payer: Self-pay

## 2021-08-29 ENCOUNTER — Ambulatory Visit (INDEPENDENT_AMBULATORY_CARE_PROVIDER_SITE_OTHER): Payer: 59 | Admitting: Women's Health

## 2021-08-29 ENCOUNTER — Encounter: Payer: Self-pay | Admitting: Women's Health

## 2021-08-29 VITALS — BP 124/73 | HR 76 | Wt 213.0 lb

## 2021-08-29 DIAGNOSIS — Z3483 Encounter for supervision of other normal pregnancy, third trimester: Secondary | ICD-10-CM

## 2021-08-29 NOTE — Patient Instructions (Signed)
Raquel Sarna, thank you for choosing our office today! We appreciate the opportunity to meet your healthcare needs. You may receive a short survey by mail, e-mail, or through [employer]. If you are happy with your care we would appreciate if you could take just a few minutes to complete the survey questions. We read all of your comments and take your feedback very seriously. Thank you again for choosing our office.  ?Center for [employer] Team at [facility] ? ?Women's & [place] at [facility] ?([address] [place], [place] [address]) ?Entrance C, located off of [address] ?Free 24/7 valet parking  ? ?CLASSES: Go to [email or web address] to register for classes (childbirth, breastfeeding, waterbirth, infant CPR, daddy bootcamp, etc.) ? ?Call the office [phone]) or go to [facility] if: ?You begin to have strong, frequent contractions ?Your water breaks.  Sometimes it is a big gush of fluid, sometimes it is just a trickle that keeps getting your panties wet or running down your legs ?You have vaginal bleeding.  It is normal to have a small amount of spotting if your cervix was checked.  ?You don't feel your baby moving like normal.  If you don't, get you something to eat and drink and lay down and focus on feeling your baby move.   If your baby is still not moving like normal, you should call the office or go to [facility]. ? ?Call the office [phone]) or go to [facility] hospital for these signs of pre-eclampsia: ?Severe headache that does not go away with Tylenol ?Visual changes- seeing spots, double, blurred vision ?Pain under your right breast or upper abdomen that does not go away with Tums or heartburn medicine ?Nausea and/or vomiting ?Severe swelling in your hands, feet, and face  ? ?[place] Pediatricians/Family Doctors ?[facility]): [address] [place], [phone]           ?[facility]: [address], [phone]                 ?[facility]): [place], [phone] (call to ask if accepting patients) ?[facility] Department: [address], [place], [place]   ? ?[place] Pediatricians/Family Doctors ?[facility]): [address], [phone] ?[place]: [address] [place], [phone] ?[facility] [place]: [place], [phone] ? ?[place]  ?[facility]): [phone] ?[facility]: [place], [phone]  ? ?[place] ?[place]: [place] [address], [phone] ? ?[place]  ?[facility]: [phone], [phone] ? ?Home Blood Pressure Monitoring for Patients  ? ?Your provider has recommended that you check your blood pressure (BP) at least once a week at home. If you do not have a blood pressure cuff at home, one will be provided for you. Contact your provider if you have not received your monitor within 1 week.  ? ?Helpful Tips for Accurate Home Blood Pressure Checks  ?Don't smoke, exercise, or drink caffeine 30 minutes before checking your BP ?Use the restroom before checking your BP (a full bladder can raise your pressure) ?Relax in a comfortable upright chair ?Feet on the ground ?Left arm resting comfortably on a flat surface at the level of your heart ?Legs uncrossed ?Back supported ?Sit quietly and don't talk ?Place the cuff on your bare arm ?Adjust snuggly, so that only two fingertips  can fit between your skin and the top of the cuff ?Check 2 readings separated by at least one minute ?Keep a log of your BP readings ?For a visual, please reference this diagram: http://ccnc.care/bpdiagram ? ?Provider Name: [facility]     Phone: [phone] ? ?Zone 1: ALL CLEAR  ?Continue to monitor your symptoms:  ?BP reading is less than 140 (top number) or less than 90 (bottom number)  ?No right  upper stomach pain ?No headaches or seeing spots ?No feeling nauseated or throwing up ?No swelling in face and hands ? ?Zone 2: CAUTION ?Call your doctor's office for any of the following:  ?BP reading is greater than 140 (top number) or greater than 90 (bottom number)  ?Stomach pain under your ribs in the middle or right side ?Headaches or seeing spots ?Feeling nauseated or throwing up ?Swelling in face and hands ? ?Zone 3: EMERGENCY  ?Seek immediate medical care if you have any of the following:  ?BP reading is greater than160 (top number) or greater than 110 (bottom number) ?Severe headaches not improving with Tylenol ?Serious difficulty catching your breath ?Any worsening symptoms from Zone 2  ? ?Braxton Hicks Contractions ?Contractions of the uterus can occur throughout pregnancy, but they are not always a sign that you are in labor. You may have practice contractions called Braxton Hicks contractions. These false labor contractions are sometimes confused with true labor. ?What are [place] contractions? ?Braxton Hicks contractions are tightening movements that occur in the muscles of the uterus before labor. Unlike true labor contractions, these contractions do not result in opening (dilation) and thinning of the cervix. Toward the end of pregnancy (32-34 weeks), Braxton Hicks contractions can happen more often and may become stronger. These contractions are sometimes difficult to tell apart from true labor because they can be very uncomfortable. You should not feel embarrassed if you go to the hospital with false labor. ?Sometimes, the only way to tell if you are in true labor is for your health care provider to look for changes in the cervix. The health care provider will do a physical exam and may monitor your contractions. If you are not in true labor, the exam should show that your cervix is not dilating and your water has not broken. ?If there are no other health problems associated with your  pregnancy, it is completely safe for you to be sent home with false labor. You may continue to have Braxton Hicks contractions until you go into true labor. ?How to tell the difference between true labor and false labor ?True labor ?Contractions last 30-70 seconds. ?Contractions become very regular. ?Discomfort is usually felt in the top of the uterus, and it spreads to the lower abdomen and low back. ?Contractions do not go away with walking. ?Contractions usually become more intense and increase in frequency. ?The cervix dilates and gets thinner. ?False labor ?Contractions are usually shorter and not as strong as true labor contractions. ?Contractions are usually irregular. ?Contractions are often felt in the front of the lower abdomen and in the groin. ?Contractions may go away when you walk around or change positions while lying down. ?Contractions get weaker and are shorter-lasting as time goes on. ?The cervix usually does not dilate or become thin. ?Follow these instructions at home: ? ?Take over-the-counter and prescription medicines only as told by your health care provider. ?Keep up with your usual exercises and follow other instructions from your health care provider. ?Eat and drink lightly if you think  you are going into labor. ?If Braxton Hicks contractions are making you uncomfortable: ?Change your position from lying down or resting to walking, or change from walking to resting. ?Sit and rest in a tub of warm water. ?Drink enough fluid to keep your urine pale yellow. Dehydration may cause these contractions. ?Do slow and deep breathing several times an hour. ?Keep all follow-up prenatal visits as told by your health care provider. This is important. ?Contact a health care provider if: ?You have a fever. ?You have continuous pain in your abdomen. ?Get help right away if: ?Your contractions become stronger, more regular, and closer together. ?You have fluid leaking or gushing from your vagina. ?You pass  blood-tinged mucus (bloody show). ?You have bleeding from your vagina. ?You have low back pain that you never had before. ?You feel your baby?s head pushing down and causing pelvic pressure. ?Your baby is not mo

## 2021-08-29 NOTE — Progress Notes (Signed)
? ? ?LOW-RISK PREGNANCY VISIT ?Patient name: Miranda Campbell MRN [id number]  Date of birth: 22-May-1996 ?Chief Complaint:   ?Routine Prenatal Visit ? ?History of Present Illness:   ?Miranda Campbell is a 26 y.o. G56P1001 female at [redacted]w[redacted]d with an Estimated Date of Delivery: 09/16/21 being seen today for ongoing management of a low-risk pregnancy.  ? ?Today she reports no complaints. Contractions: Irritability. Vag. Bleeding: None.  Movement: Present. denies leaking of fluid. ? ? ?  06/20/2021  ?  9:05 AM 03/04/2021  ? 10:25 AM 01/28/2018  ?  3:01 PM  ?Depression screen PHQ 2/9  ?Decreased Interest 1 1 3   ?Down, Depressed, Hopeless 1 0 1  ?PHQ - 2 Score 2 1 4   ?Altered sleeping 2 0 0  ?Tired, decreased energy 2 2 2   ?Change in appetite 1 2 2   ?Feeling bad or failure about yourself  0 0 0  ?Trouble concentrating 0 0 0  ?Moving slowly or fidgety/restless 0 0 0  ?Suicidal thoughts 0 0 0  ?PHQ-9 Score 7 5 8   ?Difficult doing work/chores   Not difficult at all  ? ?  ? ?  06/20/2021  ?  9:05 AM 03/04/2021  ? 10:29 AM  ?GAD 7 : Generalized Anxiety Score  ?Nervous, Anxious, on Edge 1 2  ?Control/stop worrying 0 1  ?Worry too much - different things 1 2  ?Trouble relaxing 1 1  ?Restless 0 1  ?Easily annoyed or irritable 1 2  ?Afraid - awful might happen 1 2  ?Total GAD 7 Score 5 11  ? ? ?  ?Review of Systems:   ?Pertinent items are noted in HPI ?Denies abnormal vaginal discharge w/ itching/odor/irritation, headaches, visual changes, shortness of breath, chest pain, abdominal pain, severe nausea/vomiting, or problems with urination or bowel movements unless otherwise stated above. ?Pertinent History Reviewed:  ?Reviewed past medical,surgical, social, obstetrical and family history.  ?Reviewed problem list, medications and allergies. ?Physical Assessment:  ? ?Vitals:  ? 08/29/21 1018  ?BP: 124/73  ?Pulse: 76  ?Weight: 213 lb (96.6 kg)  ?Body mass index is 37.73 kg/m?. ?  ?     Physical Examination:  ? General appearance: Well appearing,  and in no distress ? Mental status: Alert, oriented to person, place, and time ? Skin: Warm & dry ? Cardiovascular: Normal heart rate noted ? Respiratory: Normal respiratory effort, no distress ? Abdomen: Soft, gravid, nontender ? Pelvic: Cervical exam deferred        ? Extremities: Edema: None ? ?Fetal Status: Fetal Heart Rate (bpm): 155 Fundal Height: 37 cm Movement: Present   ? ?Chaperone: N/A   ?No results found for this or any previous visit (from the past 24 hour(s)).  ?Assessment & Plan:  ?1) Low-risk pregnancy G2P1001 at [redacted]w[redacted]d with an Estimated Date of Delivery: 09/16/21  ?  ?Meds: No orders of the defined types were placed in this encounter. ? ?Labs/procedures today: none ? ?Plan:  Continue routine obstetrical care  ?Next visit: prefers in person   ? ?Reviewed: Preterm labor symptoms and general obstetric precautions including but not limited to vaginal bleeding, contractions, leaking of fluid and fetal movement were reviewed in detail with the patient.  All questions were answered. Does have home bp cuff. Office bp cuff given: not applicable. Check bp weekly, let 06/22/2021 know if consistently >140 and/or >90. ? ?Follow-up: Return for weekly, As scheduled. ? ?Future Appointments  ?Date Time Provider Department Center  ?09/05/2021  8:30 AM 08/31/21, CNM [facility]  ?  09/12/2021  9:10 AM Myna Hidalgo, DO [facility]  ? ? ?No orders of the defined types were placed in this encounter. ? ?Cheral Marker CNM, WHNP-BC ?08/29/2021 ?10:37 AM  ?

## 2021-09-05 ENCOUNTER — Encounter: Payer: Self-pay | Admitting: Women's Health

## 2021-09-05 ENCOUNTER — Ambulatory Visit (INDEPENDENT_AMBULATORY_CARE_PROVIDER_SITE_OTHER): Payer: 59 | Admitting: Women's Health

## 2021-09-05 VITALS — BP 119/77 | HR 81 | Wt 211.8 lb

## 2021-09-05 DIAGNOSIS — Z3483 Encounter for supervision of other normal pregnancy, third trimester: Secondary | ICD-10-CM

## 2021-09-05 NOTE — Progress Notes (Signed)
? ? ?LOW-RISK PREGNANCY VISIT ?Patient name: Miranda Campbell MRN [id number]  Date of birth: [birth year] ?Chief Complaint:   ?Routine Prenatal Visit ? ?History of Present Illness:   ?Miranda Campbell is a 26 y.o. G53P1001 female at [redacted]w[redacted]d with an Estimated Date of Delivery: 09/16/21 being seen today for ongoing management of a low-risk pregnancy.  ? ?Today she reports no complaints. Contractions: Irritability. Vag. Bleeding: None.  Movement: Present. denies leaking of fluid. ? ? ?  06/20/2021  ?  9:05 AM 03/04/2021  ? 10:25 AM 01/28/2018  ?  3:01 PM  ?Depression screen PHQ 2/9  ?Decreased Interest 1 1 3   ?Down, Depressed, Hopeless 1 0 1  ?PHQ - 2 Score 2 1 4   ?Altered sleeping 2 0 0  ?Tired, decreased energy 2 2 2   ?Change in appetite 1 2 2   ?Feeling bad or failure about yourself  0 0 0  ?Trouble concentrating 0 0 0  ?Moving slowly or fidgety/restless 0 0 0  ?Suicidal thoughts 0 0 0  ?PHQ-9 Score 7 5 8   ?Difficult doing work/chores   Not difficult at all  ? ?  ? ?  06/20/2021  ?  9:05 AM 03/04/2021  ? 10:29 AM  ?GAD 7 : Generalized Anxiety Score  ?Nervous, Anxious, on Edge 1 2  ?Control/stop worrying 0 1  ?Worry too much - different things 1 2  ?Trouble relaxing 1 1  ?Restless 0 1  ?Easily annoyed or irritable 1 2  ?Afraid - awful might happen 1 2  ?Total GAD 7 Score 5 11  ? ? ?  ?Review of Systems:   ?Pertinent items are noted in HPI ?Denies abnormal vaginal discharge w/ itching/odor/irritation, headaches, visual changes, shortness of breath, chest pain, abdominal pain, severe nausea/vomiting, or problems with urination or bowel movements unless otherwise stated above. ?Pertinent History Reviewed:  ?Reviewed past medical,surgical, social, obstetrical and family history.  ?Reviewed problem list, medications and allergies. ?Physical Assessment:  ? ?Vitals:  ? 09/05/21 0835  ?BP: 119/77  ?Pulse: 81  ?Weight: 211 lb 12.8 oz (96.1 kg)  ?Body mass index is 37.52 kg/m?. ?  ?     Physical Examination:  ? General appearance: Well  appearing, and in no distress ? Mental status: Alert, oriented to person, place, and time ? Skin: Warm & dry ? Cardiovascular: Normal heart rate noted ? Respiratory: Normal respiratory effort, no distress ? Abdomen: Soft, gravid, nontender ? Pelvic: Cervical exam deferred        ? Extremities: Edema: None ? ?Fetal Status: Fetal Heart Rate (bpm): 141 Fundal Height: 38 cm Movement: Present Presentation: Vertex ? ?Chaperone: N/A   ?No results found for this or any previous visit (from the past 24 hour(s)).  ?Assessment & Plan:  ?1) Low-risk pregnancy G2P1001 at [redacted]w[redacted]d with an Estimated Date of Delivery: 09/16/21  ?  ?Meds: No orders of the defined types were placed in this encounter. ? ?Labs/procedures today: none ? ?Plan:  Continue routine obstetrical care  ?Next visit: prefers in person   ? ?Reviewed: Term labor symptoms and general obstetric precautions including but not limited to vaginal bleeding, contractions, leaking of fluid and fetal movement were reviewed in detail with the patient.  All questions were answered. Does have home bp cuff. Office bp cuff given: not applicable. Check bp weekly, let 06/22/2021 know if consistently >140 and/or >90. ? ?Follow-up: Return for As scheduled. ? ?Future Appointments  ?Date Time Provider Department Center  ?09/12/2021  9:10 AM 11/05/21, DO [facility]  ? ? ?  No orders of the defined types were placed in this encounter. ? ?Cheral Marker CNM, WHNP-BC ?09/05/2021 ?8:53 AM  ?

## 2021-09-05 NOTE — Patient Instructions (Signed)
Miranda Campbell, thank you for choosing our office today! We appreciate the opportunity to meet your healthcare needs. You may receive a short survey by mail, e-mail, or through MyChart. If you are happy with your care we would appreciate if you could take just a few minutes to complete the survey questions. We read all of your comments and take your feedback very seriously. Thank you again for choosing our office.  ?[facility] ? ?[facility] ?([address] [place], [place] [address]) ?Entrance C, located off of [address] ?Free 24/7 valet parking  ? ?CLASSES: Go to [email or web address] to register for classes (childbirth, breastfeeding, waterbirth, infant CPR, daddy bootcamp, etc.) ? ?Call the office ([phone]) or go to [facility] if: ?You begin to have strong, frequent contractions ?Your water breaks.  Sometimes it is a big gush of fluid, sometimes it is just a trickle that keeps getting your panties wet or running down your legs ?You have vaginal bleeding.  It is normal to have a small amount of spotting if your cervix was checked.  ?You don't feel your baby moving like normal.  If you don't, get you something to eat and drink and lay down and focus on feeling your baby move.   If your baby is still not moving like normal, you should call the office or go to [facility]. ? ?Call the office ([phone]) or go to [facility] for these signs of pre-eclampsia: ?Severe headache that does not go away with Tylenol ?Visual changes- seeing spots, double, blurred vision ?Pain under your right breast or upper abdomen that does not go away with Tums or heartburn medicine ?Nausea and/or vomiting ?Severe swelling in your hands, feet, and face  ? ?[place] Pediatricians/Family Doctors ?[facility] ([facility]): [address], [phone]           ?[facility]: [address], [phone]                 ?[facility] ([facility]): [address], [phone] (call to ask if accepting patients) ?[facility]: [address], [place], [phone]   ? ?[place] Pediatricians/Family Doctors ?[facility] ([facility]): [address], [phone] ?[facility]: [address], [phone] ?[facility]: [address], [phone] ? ?[place] [facility]  ?[facility] ([facility]): [phone] ?[facility]: [address], [phone]  ? ?[place] Family Doctors ?[facility]: [address], [phone] ? ?[place] [facility] Doctors  ?[facility]: [address], [phone] ? ?Home Blood Pressure Monitoring for Patients  ? ?Your provider has recommended that you check your blood pressure (BP) at least once a week at home. If you do not have a blood pressure cuff at home, one will be provided for you. Contact your provider if you have not received your monitor within 1 week.  ? ?Helpful Tips for Accurate Home Blood Pressure Checks  ?Don't smoke, exercise, or drink caffeine 30 minutes before checking your BP ?Use the restroom before checking your BP (a full bladder can raise your pressure) ?Relax in a comfortable upright chair ?Feet on the ground ?Left arm resting comfortably on a flat surface at the level of your heart ?Legs uncrossed ?Back supported ?Sit quietly and don't talk ?Place the cuff on your bare arm ?Adjust snuggly, so that only two fingertips   can fit between your skin and the top of the cuff ?Check 2 readings separated by at least one minute ?Keep a log of your BP readings ?For a visual, please reference this diagram: http://ccnc.care/bpdiagram ? ?Provider Name: [facility]     Phone: [phone] ? ?Zone 1: ALL CLEAR  ?Continue to monitor your symptoms:  ?BP reading is less than 140 (top number) or less than 90 (bottom number)  ?No right  upper stomach pain ?No headaches or seeing spots ?No feeling nauseated or throwing up ?No swelling in face and hands ? ?Zone 2: CAUTION ?Call your doctor's office for any of the following:  ?BP reading is greater than 140 (top number) or greater than 90 (bottom number)  ?Stomach pain under your ribs in the middle or right side ?Headaches or seeing spots ?Feeling nauseated or throwing up ?Swelling in face and hands ? ?Zone 3: EMERGENCY  ?Seek immediate medical care if you have any of the following:  ?BP reading is greater than160 (top number) or greater than 110 (bottom number) ?Severe headaches not improving with Tylenol ?Serious difficulty catching your breath ?Any worsening symptoms from Zone 2  ? ?Braxton Hicks Contractions ?Contractions of the uterus can occur throughout pregnancy, but they are not always a sign that you are in labor. You may have practice contractions called Braxton Hicks contractions. These false labor contractions are sometimes confused with true labor. ?What are Miranda Campbell contractions? ?Braxton Hicks contractions are tightening movements that occur in the muscles of the uterus before labor. Unlike true labor contractions, these contractions do not result in opening (dilation) and thinning of the cervix. Toward the end of pregnancy (32-34 weeks), Braxton Hicks contractions can happen more often and may become stronger. These contractions are sometimes difficult to tell apart from true labor because they can be very uncomfortable. You should not feel embarrassed if you go to the hospital with false labor. ?Sometimes, the only way to tell if you are in true labor is for your health care provider to look for changes in the cervix. The health care provider will do a physical exam and may monitor your contractions. If you are not in true labor, the exam should show that your cervix is not dilating and your water has not broken. ?If there are no other health problems associated with your  pregnancy, it is completely safe for you to be sent home with false labor. You may continue to have Braxton Hicks contractions until you go into true labor. ?How to tell the difference between true labor and false labor ?True labor ?Contractions last 30-70 seconds. ?Contractions become very regular. ?Discomfort is usually felt in the top of the uterus, and it spreads to the lower abdomen and low back. ?Contractions do not go away with walking. ?Contractions usually become more intense and increase in frequency. ?The cervix dilates and gets thinner. ?False labor ?Contractions are usually shorter and not as strong as true labor contractions. ?Contractions are usually irregular. ?Contractions are often felt in the front of the lower abdomen and in the groin. ?Contractions may go away when you walk around or change positions while lying down. ?Contractions get weaker and are shorter-lasting as time goes on. ?The cervix usually does not dilate or become thin. ?Follow these instructions at home: ? ?Take over-the-counter and prescription medicines only as told by your health care provider. ?Keep up with your usual exercises and follow other instructions from your health care provider. ?Eat and drink lightly if you think  you are going into labor. ?If Braxton Hicks contractions are making you uncomfortable: ?Change your position from lying down or resting to walking, or change from walking to resting. ?Sit and rest in a tub of warm water. ?Drink enough fluid to keep your urine pale yellow. Dehydration may cause these contractions. ?Do slow and deep breathing several times an hour. ?Keep all follow-up prenatal visits as told by your health care provider. This is important. ?Contact a health care provider if: ?You have a fever. ?You have continuous pain in your abdomen. ?Get help right away if: ?Your contractions become stronger, more regular, and closer together. ?You have fluid leaking or gushing from your vagina. ?You pass  blood-tinged mucus (bloody show). ?You have bleeding from your vagina. ?You have low back pain that you never had before. ?You feel your baby?s head pushing down and causing pelvic pressure. ?Your baby is not mo

## 2021-09-12 ENCOUNTER — Ambulatory Visit (INDEPENDENT_AMBULATORY_CARE_PROVIDER_SITE_OTHER): Payer: 59 | Admitting: Obstetrics & Gynecology

## 2021-09-12 ENCOUNTER — Encounter: Payer: Self-pay | Admitting: Obstetrics & Gynecology

## 2021-09-12 VITALS — BP 119/75 | HR 83 | Wt 211.2 lb

## 2021-09-12 DIAGNOSIS — Z3480 Encounter for supervision of other normal pregnancy, unspecified trimester: Secondary | ICD-10-CM

## 2021-09-12 NOTE — Progress Notes (Signed)
? ?  LOW-RISK PREGNANCY VISIT ?Patient name: Miranda Campbell MRN [id number]  Date of birth: Oct 25, 1995 ?Chief Complaint:   ?Routine Prenatal Visit ? ?History of Present Illness:   ?Miranda Campbell is a 26 y.o. G41P1001 female at [redacted]w[redacted]d with an Estimated Date of Delivery: 09/16/21 being seen today for ongoing management of a low-risk pregnancy.  ? ? ?  06/20/2021  ?  9:05 AM 03/04/2021  ? 10:25 AM 01/28/2018  ?  3:01 PM  ?Depression screen PHQ 2/9  ?Decreased Interest 1 1 3   ?Down, Depressed, Hopeless 1 0 1  ?PHQ - 2 Score 2 1 4   ?Altered sleeping 2 0 0  ?Tired, decreased energy 2 2 2   ?Change in appetite 1 2 2   ?Feeling bad or failure about yourself  0 0 0  ?Trouble concentrating 0 0 0  ?Moving slowly or fidgety/restless 0 0 0  ?Suicidal thoughts 0 0 0  ?PHQ-9 Score 7 5 8   ?Difficult doing work/chores   Not difficult at all  ? ? ?Today she reports no complaints. Contractions: Regular. Vag. Bleeding: None.  Movement: Present. denies leaking of fluid. ?Review of Systems:   ?Pertinent items are noted in HPI ?Denies abnormal vaginal discharge w/ itching/odor/irritation, headaches, visual changes, shortness of breath, chest pain, abdominal pain, severe nausea/vomiting, or problems with urination or bowel movements unless otherwise stated above. ?Pertinent History Reviewed:  ?Reviewed past medical,surgical, social, obstetrical and family history.  ?Reviewed problem list, medications and allergies. ? ?Physical Assessment:  ? ?Vitals:  ? 09/12/21 0914  ?BP: 119/75  ?Pulse: 83  ?Weight: 211 lb 3.2 oz (95.8 kg)  ?Body mass index is 37.41 kg/m?. ?  ?     Physical Examination:  ? General appearance: Well appearing, and in no distress ? Mental status: Alert, oriented to person, place, and time ? Skin: Warm & dry ? Respiratory: Normal respiratory effort, no distress ? Abdomen: Soft, gravid, nontender ? Pelvic: Cervical exam performed  Dilation: 2 Effacement (%): 50 Station: -2 ? Extremities: Edema: None ? Psych:  mood and affect  appropriate ? ?Fetal Status: Fetal Heart Rate (bpm): 140 Fundal Height: 38 cm Movement: Present Presentation: Vertex ? ?Chaperone:  pt declined    ? ?No results found for this or any previous visit (from the past 24 hour(s)).  ? ?Assessment & Plan:  ?1) Low-risk pregnancy G2P1001 at [redacted]w[redacted]d with an Estimated Date of Delivery: 09/16/21  ? ?  ?Meds: No orders of the defined types were placed in this encounter. ? ?Labs/procedures today: none ? ?Plan:  Continue routine obstetrical care  ?Next visit: prefers in person   ? ?Reviewed: Term labor symptoms and general obstetric precautions including but not limited to vaginal bleeding, contractions, leaking of fluid and fetal movement were reviewed in detail with the patient.  All questions were answered. Pt has home bp cuff. Check bp weekly, let know if >140/90.  ? ?Follow-up: Return in about 1 week (around 09/19/2021) for LROB visit. ? ?No orders of the defined types were placed in this encounter. ? ? ?11/12/21, DO ?Attending Obstetrician & Gynecologist, Faculty Practice ?[facility] [redacted]w[redacted]d, [facility] ? ? ? ?

## 2021-09-15 ENCOUNTER — Encounter: Payer: Self-pay | Admitting: Women's Health

## 2021-09-19 ENCOUNTER — Encounter: Payer: 59 | Admitting: Women's Health

## 2021-09-20 ENCOUNTER — Encounter: Payer: Self-pay | Admitting: Obstetrics & Gynecology

## 2021-09-20 ENCOUNTER — Other Ambulatory Visit: Payer: Self-pay | Admitting: Women's Health

## 2021-09-20 ENCOUNTER — Ambulatory Visit (INDEPENDENT_AMBULATORY_CARE_PROVIDER_SITE_OTHER): Payer: 59 | Admitting: Obstetrics & Gynecology

## 2021-09-20 VITALS — BP 120/72 | HR 75 | Wt 213.6 lb

## 2021-09-20 DIAGNOSIS — O480 Post-term pregnancy: Secondary | ICD-10-CM | POA: Diagnosis not present

## 2021-09-20 DIAGNOSIS — Z3480 Encounter for supervision of other normal pregnancy, unspecified trimester: Secondary | ICD-10-CM

## 2021-09-20 NOTE — Progress Notes (Signed)
? ?  LOW-RISK PREGNANCY VISIT ?Patient name: Miranda Campbell MRN [id number]  Date of birth: 11/12/1995 ?Chief Complaint:   ?Routine Prenatal Visit (NST today) ? ?History of Present Illness:   ?Miranda Campbell is a 26 y.o. G68P1001 female at [redacted]w[redacted]d with an Estimated Date of Delivery: 09/16/21 being seen today for ongoing management of a low-risk pregnancy.  ? ?  06/20/2021  ?  9:05 AM 03/04/2021  ? 10:25 AM 01/28/2018  ?  3:01 PM  ?Depression screen PHQ 2/9  ?Decreased Interest 1 1 3   ?Down, Depressed, Hopeless 1 0 1  ?PHQ - 2 Score 2 1 4   ?Altered sleeping 2 0 0  ?Tired, decreased energy 2 2 2   ?Change in appetite 1 2 2   ?Feeling bad or failure about yourself  0 0 0  ?Trouble concentrating 0 0 0  ?Moving slowly or fidgety/restless 0 0 0  ?Suicidal thoughts 0 0 0  ?PHQ-9 Score 7 5 8   ?Difficult doing work/chores   Not difficult at all  ? ? ?Today she reports no complaints. Contractions: Irregular. Vag. Bleeding: None.  Movement: Present. denies leaking of fluid. ?Review of Systems:   ?Pertinent items are noted in HPI ?Denies abnormal vaginal discharge w/ itching/odor/irritation, headaches, visual changes, shortness of breath, chest pain, abdominal pain, severe nausea/vomiting, or problems with urination or bowel movements unless otherwise stated above. ?Pertinent History Reviewed:  ?Reviewed past medical,surgical, social, obstetrical and family history.  ?Reviewed problem list, medications and allergies. ? ?Physical Assessment:  ? ?Vitals:  ? 09/20/21 1347  ?BP: 120/72  ?Pulse: 75  ?Weight: 213 lb 9.6 oz (96.9 kg)  ?Body mass index is 37.84 kg/m?. ?  ?     Physical Examination:  ? General appearance: Well appearing, and in no distress ? Mental status: Alert, oriented to person, place, and time ? Skin: Warm & dry ? Respiratory: Normal respiratory effort, no distress ? Abdomen: Soft, gravid, nontender ? Pelvic: Cervical exam performed  Dilation: 2.5 Effacement (%): 50 Station: -2 ? Extremities: Edema: None ? Psych:  mood and  affect appropriate ? ?Fetal Status: Fetal Heart Rate (bpm): 130 Fundal Height: 39 cm Movement: Present Presentation: Vertex ? ?Chaperone: Levy Pupa   ? ?NST being performed due to full term pregnancy ? ? ?Fetal Monitoring:  Baseline: 130 bpm, Variability: moderate, Accelerations: present, The accelerations are >15 bpm and more than 2 in 20 minutes, and Decelerations: Absent   ? ? ?Final diagnosis:   Reactive NST ? ?Assessment & Plan:  ?1) Low-risk pregnancy G2P1001 at [redacted]w[redacted]d with an Estimated Date of Delivery: 09/16/21  ? ?-scheduled for IOL tomorrow ?-GBS neg ?  ?Meds: No orders of the defined types were placed in this encounter. ? ?Labs/procedures today: NST ? ?Plan:  Continue routine obstetrical care  ?Next visit: prefers in person   ? ?Reviewed: Term labor symptoms and general obstetric precautions including but not limited to vaginal bleeding, contractions, leaking of fluid and fetal movement were reviewed in detail with the patient.  All questions were answered. Pt has home bp cuff. Check bp weekly, let us know if >140/90.  ? ?Follow-up: Return for 5wk postpartum visit . ? ?No orders of the defined types were placed in this encounter. ? ? ?Janyth Pupa, DO ?Attending [place], Faculty Practice ?[facility] [place] ? ? ? ?

## 2021-09-21 ENCOUNTER — Encounter (HOSPITAL_COMMUNITY): Payer: Self-pay | Admitting: Obstetrics and Gynecology

## 2021-09-21 ENCOUNTER — Inpatient Hospital Stay (HOSPITAL_COMMUNITY): Payer: 59 | Admitting: Anesthesiology

## 2021-09-21 ENCOUNTER — Other Ambulatory Visit: Payer: Self-pay

## 2021-09-21 ENCOUNTER — Inpatient Hospital Stay (HOSPITAL_COMMUNITY): Payer: 59

## 2021-09-21 ENCOUNTER — Inpatient Hospital Stay (HOSPITAL_COMMUNITY)
Admission: AD | Admit: 2021-09-21 | Discharge: 2021-09-22 | DRG: 807 | Disposition: A | Discharge status: Home / Self Care | Payer: 59 | Attending: Obstetrics and Gynecology | Admitting: Obstetrics and Gynecology

## 2021-09-21 DIAGNOSIS — Z8279 Family history of other congenital malformations, deformations and chromosomal abnormalities: Secondary | ICD-10-CM

## 2021-09-21 DIAGNOSIS — Z3480 Encounter for supervision of other normal pregnancy, unspecified trimester: Principal | ICD-10-CM

## 2021-09-21 DIAGNOSIS — O480 Post-term pregnancy: Secondary | ICD-10-CM | POA: Diagnosis present

## 2021-09-21 DIAGNOSIS — Z3490 Encounter for supervision of normal pregnancy, unspecified, unspecified trimester: Secondary | ICD-10-CM

## 2021-09-21 DIAGNOSIS — Z3A40 40 weeks gestation of pregnancy: Secondary | ICD-10-CM

## 2021-09-21 LAB — TYPE AND SCREEN
ABO/RH(D): A POS
Antibody Screen: NEGATIVE

## 2021-09-21 LAB — CBC
HCT: 37.3 % (ref 36.0–46.0)
Hemoglobin: 13.2 g/dL (ref 12.0–15.0)
MCH: 31.4 pg (ref 26.0–34.0)
MCHC: 35.4 g/dL (ref 30.0–36.0)
MCV: 88.8 fL (ref 80.0–100.0)
Platelets: 244 10*3/uL (ref 150–400)
RBC: 4.2 MIL/uL (ref 3.87–5.11)
RDW: 13.7 % (ref 11.5–15.5)
WBC: 11.3 10*3/uL — ABNORMAL HIGH (ref 4.0–10.5)
nRBC: 0 % (ref 0.0–0.2)

## 2021-09-21 LAB — RPR: RPR Ser Ql: NONREACTIVE

## 2021-09-21 MED ORDER — PRENATAL MULTIVITAMIN CH
1.0000 | ORAL_TABLET | Freq: Every day | ORAL | Status: DC
  Administered 2021-09-21 – 2021-09-22 (×2): 1 via ORAL
  Filled 2021-09-21 (×2): qty 1

## 2021-09-21 MED ORDER — OXYTOCIN-SODIUM CHLORIDE 30-0.9 UT/500ML-% IV SOLN: 1.0000 m[IU]/min | INTRAVENOUS | Status: DC

## 2021-09-21 MED ORDER — OXYCODONE-ACETAMINOPHEN 5-325 MG PO TABS: 1.0000 | ORAL_TABLET | ORAL | Status: DC | PRN

## 2021-09-21 MED ORDER — ACETAMINOPHEN 325 MG PO TABS: 650.0000 mg | ORAL_TABLET | ORAL | Status: DC | PRN

## 2021-09-21 MED ORDER — LIDOCAINE HCL (PF) 1 % IJ SOLN
30.0000 mL | INTRAMUSCULAR | Status: DC | PRN
Start: 2021-09-21 — End: 2021-09-21

## 2021-09-21 MED ORDER — DIPHENHYDRAMINE HCL 50 MG/ML IJ SOLN: 12.5000 mg | INTRAMUSCULAR | Status: DC | PRN

## 2021-09-21 MED ORDER — OXYTOCIN-SODIUM CHLORIDE 30-0.9 UT/500ML-% IV SOLN
2.5000 [IU]/h | INTRAVENOUS | Status: DC
  Filled 2021-09-21: qty 500

## 2021-09-21 MED ORDER — OXYCODONE-ACETAMINOPHEN 5-325 MG PO TABS: 2.0000 | ORAL_TABLET | ORAL | Status: DC | PRN

## 2021-09-21 MED ORDER — DIPHENHYDRAMINE HCL 25 MG PO CAPS: 25.0000 mg | ORAL_CAPSULE | Freq: Four times a day (QID) | ORAL | Status: DC | PRN

## 2021-09-21 MED ORDER — LACTATED RINGERS IV SOLN: 500.0000 mL | INTRAVENOUS | Status: DC | PRN

## 2021-09-21 MED ORDER — SIMETHICONE 80 MG PO CHEW: 80.0000 mg | CHEWABLE_TABLET | ORAL | Status: DC | PRN

## 2021-09-21 MED ORDER — EPHEDRINE 5 MG/ML INJ: 10.0000 mg | INTRAVENOUS | Status: DC | PRN

## 2021-09-21 MED ORDER — LIDOCAINE HCL (PF) 1 % IJ SOLN
INTRAMUSCULAR | Status: DC | PRN
  Administered 2021-09-21 (×2): 4 mL via EPIDURAL

## 2021-09-21 MED ORDER — PHENYLEPHRINE 80 MCG/ML (10ML) SYRINGE FOR IV PUSH (FOR BLOOD PRESSURE SUPPORT): 80.0000 ug | PREFILLED_SYRINGE | INTRAVENOUS | Status: DC | PRN

## 2021-09-21 MED ORDER — OXYTOCIN BOLUS FROM INFUSION
333.0000 mL | Freq: Once | INTRAVENOUS | Status: AC
  Administered 2021-09-21: 333 mL via INTRAVENOUS

## 2021-09-21 MED ORDER — FENTANYL-BUPIVACAINE-NACL 0.5-0.125-0.9 MG/250ML-% EP SOLN
12.0000 mL/h | EPIDURAL | Status: DC | PRN
  Administered 2021-09-21: 12 mL/h via EPIDURAL
  Filled 2021-09-21: qty 250

## 2021-09-21 MED ORDER — ONDANSETRON HCL 4 MG PO TABS: 4.0000 mg | ORAL_TABLET | ORAL | Status: DC | PRN

## 2021-09-21 MED ORDER — ONDANSETRON HCL 4 MG/2ML IJ SOLN: 4.0000 mg | Freq: Four times a day (QID) | INTRAMUSCULAR | Status: DC | PRN

## 2021-09-21 MED ORDER — COCONUT OIL OIL: 1.0000 "application " | TOPICAL_OIL | Status: DC | PRN

## 2021-09-21 MED ORDER — SENNOSIDES-DOCUSATE SODIUM 8.6-50 MG PO TABS
2.0000 | ORAL_TABLET | Freq: Every day | ORAL | Status: DC
  Administered 2021-09-22: 2 via ORAL
  Filled 2021-09-21: qty 2

## 2021-09-21 MED ORDER — BENZOCAINE-MENTHOL 20-0.5 % EX AERO: 1.0000 "application " | INHALATION_SPRAY | CUTANEOUS | Status: DC | PRN

## 2021-09-21 MED ORDER — FLEET ENEMA 7-19 GM/118ML RE ENEM: 1.0000 | ENEMA | RECTAL | Status: DC | PRN

## 2021-09-21 MED ORDER — DIBUCAINE (PERIANAL) 1 % EX OINT: 1.0000 "application " | TOPICAL_OINTMENT | CUTANEOUS | Status: DC | PRN

## 2021-09-21 MED ORDER — TETANUS-DIPHTH-ACELL PERTUSSIS 5-2.5-18.5 LF-MCG/0.5 IM SUSY: 0.5000 mL | PREFILLED_SYRINGE | Freq: Once | INTRAMUSCULAR | Status: DC

## 2021-09-21 MED ORDER — FENTANYL CITRATE (PF) 100 MCG/2ML IJ SOLN
100.0000 ug | INTRAMUSCULAR | Status: DC | PRN
  Administered 2021-09-21: 100 ug via INTRAVENOUS
  Filled 2021-09-21: qty 2

## 2021-09-21 MED ORDER — TERBUTALINE SULFATE 1 MG/ML IJ SOLN: 0.2500 mg | Freq: Once | INTRAMUSCULAR | Status: DC | PRN

## 2021-09-21 MED ORDER — LACTATED RINGERS IV SOLN: 500.0000 mL | Freq: Once | INTRAVENOUS | Status: DC

## 2021-09-21 MED ORDER — LACTATED RINGERS IV SOLN: INTRAVENOUS | Status: DC

## 2021-09-21 MED ORDER — IBUPROFEN 600 MG PO TABS
600.0000 mg | ORAL_TABLET | Freq: Four times a day (QID) | ORAL | Status: DC
  Administered 2021-09-21 – 2021-09-22 (×4): 600 mg via ORAL
  Filled 2021-09-21 (×4): qty 1

## 2021-09-21 MED ORDER — SOD CITRATE-CITRIC ACID 500-334 MG/5ML PO SOLN: 30.0000 mL | ORAL | Status: DC | PRN

## 2021-09-21 MED ORDER — HYDROXYZINE HCL 50 MG PO TABS: 50.0000 mg | ORAL_TABLET | Freq: Four times a day (QID) | ORAL | Status: DC | PRN

## 2021-09-21 MED ORDER — ZOLPIDEM TARTRATE 5 MG PO TABS: 5.0000 mg | ORAL_TABLET | Freq: Every evening | ORAL | Status: DC | PRN

## 2021-09-21 MED ORDER — WITCH HAZEL-GLYCERIN EX PADS
1.0000 | MEDICATED_PAD | CUTANEOUS | Status: DC | PRN
Start: 2021-09-21 — End: 2021-09-22

## 2021-09-21 MED ORDER — ONDANSETRON HCL 4 MG/2ML IJ SOLN: 4.0000 mg | INTRAMUSCULAR | Status: DC | PRN

## 2021-09-21 NOTE — Discharge Summary (Addendum)
? ?  Postpartum Discharge Summary ? ? ?   ?Patient Name: Miranda Campbell ?DOB: [birth year] ?MRN: [id number] ? ?Date of admission: 09/21/2021 ?Delivery date:09/21/2021  ?Delivering provider: Renard Matter  ?Date of discharge: 09/22/2021 ? ?Admitting diagnosis: Post-dates pregnancy [O48.0] ?Intrauterine pregnancy: [redacted]w[redacted]d    ?Secondary diagnosis:  Principal Problem: ?  Post-dates pregnancy ?Active Problems: ?  Family history of fragile X syndrome ?  Encounter for supervision of normal pregnancy, antepartum ?  SVD (spontaneous vaginal delivery) ? ?Additional problems: None    ?Discharge diagnosis: Term Pregnancy Delivered                                              ?Post partum procedures: None ?Augmentation: N/A ?Complications: None ? ?Hospital course: Onset of Labor With Vaginal Delivery      ?26y.o. yo G2P1001 at 447w5das admitted in Active Labor on 09/21/2021. Patient was initially scheduled for an IOL but she came in laboring and was found to be 6 cm. Patient had an uncomplicated labor course as follows:  ?Membrane Rupture Time/Date: 8:55 AM ,09/21/2021   ?Delivery Method:Vaginal, Spontaneous  ?Episiotomy: None  ?Lacerations:  None  ?Patient had an uncomplicated postpartum course.  She is ambulating, tolerating a regular diet, passing flatus, and urinating well. Patient is discharged home in stable condition on 09/22/21. ? ?Newborn Data: ?Birth date:09/[birth year]/2021  ?Birth time:10:07 AM  ?Gender:Female  ?Living status:Living  ?Apgars:8 ,9  ?Weight:3700 g  ? ?Magnesium Sulfate received: No ?BMZ received: No ?Rhophylac:N/A ?MMR:N/A ?T-DaP:Given prenatally ?Flu: N/A ?Transfusion:No ? ?Physical exam  ?Vitals:  ? 09/21/21 1330 09/21/21 1759 09/21/21 2200 09/22/21 0300  ?BP: 128/62 112/64 126/71 114/77  ?Pulse: 78 77 66 69  ?Resp: '20 20 18 18  ' ?Temp: 97.8 ?F (36.6 ?C) (!) 97.4 ?F (36.3 ?C) 97.9 ?F (36.6 ?C) 97.7 ?F (36.5 ?C)  ?TempSrc: Oral Oral Oral Oral  ?SpO2: 98% 99% 98% 99%  ?Weight:      ?Height:      ? ?General: alert,  cooperative, and no distress ?Lochia: appropriate ?Uterine Fundus: firm ?Incision: N/A ?DVT Evaluation: No evidence of DVT seen on physical exam. ?Labs: ?Lab Results  ?Component Value Date  ? WBC 11.3 (H) 09/21/2021  ? HGB 13.2 09/21/2021  ? HCT 37.3 09/21/2021  ? MCV 88.8 09/21/2021  ? PLT 244 09/21/2021  ? ? ?  Latest Ref Rng & Units 09/23/2018  ?  6:11 PM  ?CMP  ?Glucose 70 - 99 mg/dL 86    ?BUN 6 - 20 mg/dL 11    ?Creatinine 0.44 - 1.00 mg/dL 0.65    ?Sodium 135 - 145 mmol/L 139    ?Potassium 3.5 - 5.1 mmol/L 4.1    ?Chloride 98 - 111 mmol/L 103    ?CO2 22 - 32 mmol/L 26    ?Calcium 8.9 - 10.3 mg/dL 9.3    ?Total Protein 6.5 - 8.1 g/dL 7.5    ?Total Bilirubin 0.3 - 1.2 mg/dL 0.6    ?Alkaline Phos 38 - 126 U/L 175    ?AST 15 - 41 U/L 168    ?ALT 0 - 44 U/L 134    ? ?Edinburgh Score: ? ?  09/21/2021  ?  5:59 PM  ?Edinburgh Postnatal Depression Scale Screening Tool  ?I have been able to laugh and see the funny side of things. 0  ?I have looked forward with  enjoyment to things. 0  ?I have blamed myself unnecessarily when things went wrong. 2  ?I have been anxious or worried for no good reason. 2  ?I have felt scared or panicky for no good reason. 2  ?Things have been getting on top of me. 1  ?I have been so unhappy that I have had difficulty sleeping. 0  ?I have felt sad or miserable. 1  ?I have been so unhappy that I have been crying. 1  ?The thought of harming myself has occurred to me. 0  ?Edinburgh Postnatal Depression Scale Total 9  ? ? ? ?After visit meds:  ?Allergies as of 09/22/2021   ?No Known Allergies ?  ? ?  ?Medication List  ?  ? ?TAKE these medications   ? ?acetaminophen 325 MG tablet ?Commonly known as: Tylenol ?Take 2 tablets (650 mg total) by mouth every 4 (four) hours as needed (for pain scale < 4). ?  ?ibuprofen 600 MG tablet ?Commonly known as: ADVIL ?Take 1 tablet (600 mg total) by mouth every 6 (six) hours. ?  ?PRENATAL AD PO ?Take 1 tablet by mouth every morning. ?  ? ?  ? ? ? ?Discharge home in  stable condition ?Infant Feeding: Bottle ?Infant Disposition:home with mother ?Discharge instruction: per After Visit Summary and Postpartum booklet. ?Activity: Advance as tolerated. Pelvic rest for 6 weeks.  ?Diet: routine diet ?Future Appointments: ?Future Appointments  ?Date Time Provider [place]  ?10/25/2021  8:30 AM Janyth Pupa, DO [facility]  ? ?Follow up Visit: ?Message sent to [facility] by Dr. Cy Blamer on 4/19 ? ?Please schedule this patient for a In person postpartum visit in 4 weeks with the following provider: Any provider. ?Additional Postpartum F/U: None   ?Low risk pregnancy complicated by:  None ?Delivery mode:  Vaginal, Spontaneous  ?Anticipated Birth Control:   plans OCPs ? ? ?09/22/2021 ?Alen Bleacher, MD ? ?CNM attestation ?I have seen and examined this patient and agree with above documentation in the resident's note.  ? ?Miranda Campbell is a 26 y.o. [id number] s/p vag del.   Pain is well controlled.  Plan for birth control is oral contraceptives (estrogen/progesterone).  Method of Feeding: bottle ? ?PE:  ?BP 114/77 (BP Location: Right Arm)   Pulse 69   Temp 97.7 ?F (36.5 ?C) (Oral)   Resp 18   Ht '5\' 3"'  (1.6 m)   Wt 96.5 kg   LMP 12/01/2020 (Exact Date)   SpO2 99%   Breastfeeding Unknown   BMI 37.68 kg/m?  ?Fundus firm ? ?No results for input(s): HGB, HCT in the last 72 hours. ? ? ?Plan: discharge today ?- postpartum care discussed ?- f/u clinic in 6 weeks for postpartum visit ? ? ?Myrtis Ser, CNM ?10:21 AM ?  ? ?

## 2021-09-21 NOTE — Anesthesia Procedure Notes (Signed)
Epidural ?Patient location during procedure: OB ?Start time: 09/21/2021 9:04 AM ?End time: 09/21/2021 9:08 AM ? ?Staffing ?Anesthesiologist: Brennan Bailey, MD ?Performed: anesthesiologist  ? ?Preanesthetic Checklist ?Completed: patient identified, IV checked, risks and benefits discussed, monitors and equipment checked, pre-op evaluation and timeout performed ? ?Epidural ?Patient position: sitting ?Prep: DuraPrep and site prepped and draped ?Patient monitoring: continuous pulse ox, blood pressure and heart rate ?Approach: midline ?Location: L3-L4 ?Injection technique: LOR air ? ?Needle:  ?Needle type: Tuohy  ?Needle gauge: 17 G ?Needle length: 9 cm ?Needle insertion depth: 6 cm ?Catheter type: closed end flexible ?Catheter size: 19 Gauge ?Catheter at skin depth: 11 cm ?Test dose: negative and Other (1% lidocaine) ? ?Assessment ?Events: blood not aspirated, injection not painful, no injection resistance, no paresthesia and negative IV test ? ?Additional Notes ?Patient identified. Risks, benefits, and alternatives discussed with patient including but not limited to bleeding, infection, nerve damage, paralysis, failed block, incomplete pain control, headache, blood pressure changes, nausea, vomiting, reactions to medication, itching, and postpartum back pain. Confirmed with bedside nurse the patient's most recent platelet count. Confirmed with patient that they are not currently taking any anticoagulation, have any bleeding history, or any family history of bleeding disorders. Patient expressed understanding and wished to proceed. All questions were answered. Sterile technique was used throughout the entire procedure. Please see nursing notes for vital signs.  ? ?Crisp LOR on second attempt. Test dose was given through epidural catheter and negative prior to continuing to dose epidural or start infusion. Warning signs of high block given to the patient including shortness of breath, tingling/numbness in hands,  complete motor block, or any concerning symptoms with instructions to call for help. Patient was given instructions on fall risk and not to get out of bed. All questions and concerns addressed with instructions to call with any issues or inadequate analgesia.  Reason for block:procedure for pain ? ? ? ?

## 2021-09-21 NOTE — Lactation Note (Signed)
This note was copied from a baby's chart. ?Lactation Consultation Note ? ?Patient Name: Miranda Campbell ?Today's Date: 09/21/2021 ?  ?Age:26 hours ?Mom stated formula feeding only.  ? ?Maternal Data ?  ? ?Feeding ?Mother's Current Feeding Choice: Formula ? ?LATCH Score ?  ? ?  ? ?  ? ?  ? ?  ? ?  ? ? ?Lactation Tools Discussed/Used ?  ? ?Interventions ?  ? ?Discharge ?  ? ?Consult Status ?Consult Status: Complete (mother declined follow up) (Mom feeding choice is formula only) ? ? ? ?[name]  Nicholson-Springer ?09/21/2021, 6:22 PM ? ? ? ?

## 2021-09-21 NOTE — Anesthesia Preprocedure Evaluation (Signed)
Anesthesia Evaluation  Patient identified by MRN, date of birth, ID band Patient awake    Reviewed: Allergy & Precautions, Patient's Chart, lab work & pertinent test results  History of Anesthesia Complications Negative for: history of anesthetic complications  Airway Mallampati: II  TM Distance: >3 FB Neck ROM: Full    Dental no notable dental hx.    Pulmonary neg pulmonary ROS,    Pulmonary exam normal        Cardiovascular negative cardio ROS Normal cardiovascular exam     Neuro/Psych Anxiety negative neurological ROS     GI/Hepatic negative GI ROS, Neg liver ROS,   Endo/Other  negative endocrine ROS  Renal/GU negative Renal ROS  negative genitourinary   Musculoskeletal negative musculoskeletal ROS (+)   Abdominal   Peds  Hematology negative hematology ROS (+)   Anesthesia Other Findings Day of surgery medications reviewed with patient.  Reproductive/Obstetrics (+) Pregnancy                             Anesthesia Physical Anesthesia Plan  ASA: 2  Anesthesia Plan: Epidural   Post-op Pain Management:    Induction:   PONV Risk Score and Plan: Treatment may vary due to age or medical condition  Airway Management Planned: Natural Airway  Additional Equipment: Fetal Monitoring  Intra-op Plan:   Post-operative Plan:   Informed Consent: I have reviewed the patients History and Physical, chart, labs and discussed the procedure including the risks, benefits and alternatives for the proposed anesthesia with the patient or authorized representative who has indicated his/her understanding and acceptance.       Plan Discussed with:   Anesthesia Plan Comments:         Anesthesia Quick Evaluation  

## 2021-09-21 NOTE — H&P (Signed)
OBSTETRIC ADMISSION HISTORY AND PHYSICAL ? ?Miranda Campbell is a 26 y.o. female G2P1001 with IUP at [redacted]w[redacted]d by 7 week [place] presenting for SOL. She states that her contractions started at 3 AM and have gotten more intense since then. She reports +FMs, no LOF, no VB, no blurry vision, headaches, peripheral edema, or RUQ pain.  She plans on bottle feeding. She requests OCPs for birth control postpartum. ? ?She received her prenatal care at [facility]. ? ?Dating: By [place] --->  Estimated Date of Delivery: 09/16/21 ? ?Sono:   ?@[redacted]w[redacted]d , normal anatomy, cephalic presentation, anterior placental lie, 383 g, 45% EFW ? ?Prenatal History/Complications:  ?Family history of Fragile X syndrome  ? ?Past Medical History: ?Past Medical History:  ?Diagnosis Date  ? Medical history non-contributory   ? ? ?Past Surgical History: ?Past Surgical History:  ?Procedure Laterality Date  ? CHOLECYSTECTOMY N/A 10/09/2018  ? Procedure: LAPAROSCOPIC CHOLECYSTECTOMY;  Surgeon: 12/09/2018, MD;  Location: [facility];  Service: General;  Laterality: N/A;  ? ? ?Obstetrical History: ?OB History   ? ? Gravida  ?2  ? Para  ?1  ? Term  ?1  ? Preterm  ?   ? AB  ?   ? Living  ?1  ?  ? ? SAB  ?   ? IAB  ?   ? Ectopic  ?   ? Multiple  ?0  ? Live Births  ?1  ?   ?  ?  ? ? ?Social History ?Social History  ? ?Socioeconomic History  ? Marital status: Married  ?  Spouse name: Miranda Campbell  ? Number of children: 0  ? Years of education: 4  ? Highest education level: High school graduate  ?Occupational History  ? Not on file  ?Tobacco Use  ? Smoking status: Never  ? Smokeless tobacco: Never  ?Vaping Use  ? Vaping Use: Never used  ?Substance and Sexual Activity  ? Alcohol use: Not Currently  ?  Comment: occasionally   ? Drug use: Never  ? Sexual activity: Yes  ?  Birth control/protection: None  ?Other Topics Concern  ? Not on file  ?Social History Narrative  ? Not on file  ? ?Social Determinants of Health  ? ?Financial Resource Strain: Low Risk   ? Difficulty of Paying  Living Expenses: Not hard at all  ?Food Insecurity: No Food Insecurity  ? Worried About Miranda Campbell in the Last Year: Never true  ? Ran Out of Food in the Last Year: Never true  ?Transportation Needs: No Transportation Needs  ? Lack of Transportation (Medical): No  ? Lack of Transportation (Non-Medical): No  ?Physical Activity: Sufficiently Active  ? Days of Exercise per Week: 3 days  ? Minutes of Exercise per Session: 60 min  ?Stress: Stress Concern Present  ? Feeling of Stress : To some extent  ?Social Connections: Moderately Isolated  ? Frequency of Communication with Friends and Family: More than three times a week  ? Frequency of Social Gatherings with Friends and Family: Twice a week  ? Attends Religious Services: Never  ? Active Member of Clubs or Organizations: No  ? Attends Programme researcher, broadcasting/film/video Meetings: Never  ? Marital Status: Married  ? ? ?Family History: ?Family History  ?Problem Relation Age of Onset  ? Spina bifida Mother   ? Colon cancer Neg Hx   ? Colon polyps Neg Hx   ? ? ?Allergies: ?No Known Allergies ? ?Medications Prior to Admission  ?Medication Sig Dispense  Refill Last Dose  ? Prenatal Vit-DSS-Fe Cbn-FA (PRENATAL AD PO) Take 1 tablet by mouth every morning.      ? ? ? ?Review of Systems  ?All systems reviewed and negative except as stated in HPI ? ?Blood pressure 117/76, pulse 76, temperature 98.1 ?F (36.7 ?C), temperature source Oral, resp. rate 18, height 5\' 3"  (1.6 m), weight 96.5 kg, last menstrual period 12/01/2020. ? ?General appearance: alert, cooperative, and no distress ?Lungs: normal work of breathing on room air  ?Heart: normal rate, warm and well perfused  ?Abdomen: soft, non-tender, gravid  ?Extremities: no LE edema or calf tenderness to palpation  ? ?Presentation:  Cephalic per RN ?Fetal monitoring: Baseline 120 bpm, moderate variability, + accels, no decels  ?Uterine activity: Every 2-3 minutes  ?Dilation: 6 ?Effacement (%): 90 ?Station: 0 ?Exam by:: [email or web address]  RNC ? ? ?Prenatal labs: ?ABO, Rh: --/--/A POS (04/19 05-22-1994) ?Antibody: NEG (04/19 0735) ?Rubella: 1.19 (09/30 1141) ?RPR: Non Reactive (01/16 0820)  ?HBsAg: Negative (09/30 1141)  ?HIV: Non Reactive (01/16 0820)  ?GBS: Negative/-- (03/20 1400)  ?2 hr Glucola normal  ?Genetic screening - LR NIPS  ?Anatomy 07-15-1970 normal  ? ?Prenatal Transfer Tool  ?Maternal Diabetes: No ?Genetic Screening: Normal ?Maternal Ultrasounds/Referrals: Normal ?Fetal Ultrasounds or other Referrals:  None ?Maternal Substance Abuse:  No ?Significant Maternal Medications:  None ?Significant Maternal Lab Results: Group B Strep negative ? ?Results for orders placed or performed during the hospital encounter of 09/21/21 (from the past 24 hour(s))  ?CBC  ? Collection Time: 09/21/21  7:33 AM  ?Result Value Ref Range  ? WBC 11.3 (H) 4.0 - 10.5 K/uL  ? RBC 4.20 3.87 - 5.11 MIL/uL  ? Hemoglobin 13.2 12.0 - 15.0 g/dL  ? HCT 37.3 36.0 - 46.0 %  ? MCV 88.8 80.0 - 100.0 fL  ? MCH 31.4 26.0 - 34.0 pg  ? MCHC 35.4 30.0 - 36.0 g/dL  ? RDW 13.7 11.5 - 15.5 %  ? Platelets 244 150 - 400 K/uL  ? nRBC 0.0 0.0 - 0.2 %  ?Type and screen  ? Collection Time: 09/21/21  7:35 AM  ?Result Value Ref Range  ? ABO/RH(D) A POS   ? Antibody Screen NEG   ? Sample Expiration    ?  09/24/2021,2359 ?Performed at [facility], [address] [place], [place] ?  ? ? ?Patient Active Problem List  ? Diagnosis Date Noted  ? Post-dates pregnancy 09/21/2021  ? Encounter for supervision of normal pregnancy, antepartum 03/04/2021  ? Calculus of gallbladder with acute cholecystitis without obstruction   ? Elevated LFTs 09/25/2018  ? RUQ pain 09/25/2018  ? Family history of fragile X syndrome 01/28/2018  ? Anxiety 01/28/2018  ? ? ?Assessment/Plan:  ?Miranda Campbell is a 26 y.o. G2P1001 at [redacted]w[redacted]d here for SOL.  ? ?#Labor: Active labor. Will continue expectant management. Anticipate SVD.  ?#Pain: Epidural requested, will notify anesthesia  ?#FWB: Cat 1 ?#ID:  GBS neg ?#MOF: Bottle   ?#MOC: OCPs ? ?[redacted]w[redacted]d, MD  ?09/21/2021, 8:38 AM ? ? ? ?

## 2021-09-21 NOTE — Progress Notes (Addendum)
Labor Progress Note ?Miranda Campbell is a 26 y.o. G2P1001 at [redacted]w[redacted]d presented for SOL  ?S: Patient is resting comfortably. Reports increased frequency in contractions.  ? ?O:  ?BP 110/63   Pulse 86   Temp 98 ?F (36.7 ?C) (Oral)   Resp 18   Ht 5\' 3"  (1.6 m)   Wt 96.5 kg   LMP 12/01/2020 (Exact Date)   SpO2 100%   BMI 37.68 kg/m?  ?EFM: 130 baseline /moderate Variability/+acceleration, few decels  ? ?CVE: Dilation: 9.5 cm ?Effacement (%): 90 ?Station: 0 ?Presentation: Vertex ?Exam by:: B Mungaray RN ? ? ?A&P: 26 y.o. G2P1001 [redacted]w[redacted]d  ?#Labor: Progressing well. Cervix is dilated to 9.5 and membrane ruptured. Will continue expectant management and anticipate SVD soon ?#Pain: Epidural ?#FWB: Cat 1  ?#GBS negative ? ? ?[redacted]w[redacted]d, MD ?Center for [facility] ?9:44 AM  ? ?GME ATTESTATION:  ?I saw and evaluated the patient. I agree with the findings and the plan of care as documented in the resident?s note. ? ?[facility], MD, MPH ?OB Fellow, Faculty Practice ?[place], Center for [facility] ?09/21/2021 11:23 AM ? ?

## 2021-09-22 LAB — BIRTH TISSUE RECOVERY COLLECTION (PLACENTA DONATION)

## 2021-09-22 MED ORDER — ACETAMINOPHEN 325 MG PO TABS: 650.0000 mg | ORAL_TABLET | ORAL | 0 refills | Status: DC | PRN

## 2021-09-22 MED ORDER — IBUPROFEN 600 MG PO TABS: 600.0000 mg | ORAL_TABLET | Freq: Four times a day (QID) | ORAL | 0 refills | Status: DC

## 2021-09-22 NOTE — Progress Notes (Signed)
CSW received consult for hx of Anxiety.  CSW met with MOB to offer support and complete assessment, FOB present. CSW introduced self and asked FOB to leave the room to speak with MOB privately, FOB left the room. MOB was welcoming, pleasant, and remained engaged during assessment. CSW and MOB discussed MOB's mental health history. MOB reported that she was diagnosed with Anxiety in 2015. MOB denied any current symptoms and reported last symptoms being at the beginning of pregnancy. MOB described her anxiety symptoms as just being worried and denied having any panic attacks. MOB reported that she is not taking any medication nor participating in therapy to treat her anxiety. MOB denied needing any therapy resources. CSW inquired about MOB's coping skills, MOB reported just talking to people. MOB identified her mom and husband as supports. MOB denied any additional mental health history. MOB denied a history of PPD. CSW inquired about how MOB was feeling emotionally since giving birth, MOB reported that she was feeling fine. MOB presented calm and did not demonstrate any acute mental health signs/symptoms. CSW assessed for safety, MOB denied SI, HI, and domestic violence.  ? ?CSW provided education regarding the baby blues period vs. perinatal mood disorders, discussed treatment and gave resources for mental health follow up if concerns arise.  CSW recommends self-evaluation during the postpartum time period using the New Mom Checklist from Postpartum Progress and encouraged MOB to contact a medical professional if symptoms are noted at any time.   ? ?CSW provided review of Sudden Infant Death Syndrome (SIDS) precautions. MOB verbalized understanding and reported having all needed items for infant including a car seat and basinet.  ? ?CSW identifies no further need for intervention and no barriers to discharge at this time. ? ?Abundio Miu, LCSW ?Clinical Social Worker ?[facility] ?Cell#: [phone] ? ?

## 2021-09-22 NOTE — Anesthesia Postprocedure Evaluation (Signed)
Anesthesia Post Note ? ?Patient: Miranda Campbell ? ?Procedure(s) Performed: AN AD HOC LABOR EPIDURAL ? ?  ? ?Patient location during evaluation: Mother Baby ?Anesthesia Type: Epidural ?Level of consciousness: awake ?Pain management: satisfactory to patient ?Vital Signs Assessment: post-procedure vital signs reviewed and stable ?Respiratory status: spontaneous breathing ?Cardiovascular status: stable ?Anesthetic complications: no ? ? ?No notable events documented. ? ?Last Vitals:  ?Vitals:  ? 09/21/21 2200 09/22/21 0300  ?BP: 126/71 114/77  ?Pulse: 66 69  ?Resp: 18 18  ?Temp: 36.6 ?C 36.5 ?C  ?SpO2: 98% 99%  ?  ?Last Pain:  ?Vitals:  ? 09/22/21 0948  ?TempSrc:   ?PainSc: 0-No pain  ? ?Pain Goal:   ? ?  ?  ?  ?  ?  ?  ?  ? ?Miranda Campbell ? ? ? ? ?

## 2021-10-01 ENCOUNTER — Telehealth (HOSPITAL_COMMUNITY): Payer: Self-pay

## 2021-10-01 NOTE — Telephone Encounter (Signed)
No answer. Left message to return nurse call. ? Jerald Kief ?10/01/2021,0935 ?

## 2021-10-25 ENCOUNTER — Encounter: Payer: Self-pay | Admitting: Obstetrics & Gynecology

## 2021-10-25 ENCOUNTER — Ambulatory Visit: Payer: 59 | Admitting: Obstetrics & Gynecology

## 2021-10-25 ENCOUNTER — Ambulatory Visit (INDEPENDENT_AMBULATORY_CARE_PROVIDER_SITE_OTHER): Payer: 59 | Admitting: Obstetrics & Gynecology

## 2021-10-25 VITALS — BP 117/75 | HR 67 | Ht 63.0 in | Wt 204.0 lb

## 2021-10-25 DIAGNOSIS — Z30011 Encounter for initial prescription of contraceptive pills: Secondary | ICD-10-CM

## 2021-10-25 DIAGNOSIS — Z3202 Encounter for pregnancy test, result negative: Secondary | ICD-10-CM | POA: Diagnosis not present

## 2021-10-25 DIAGNOSIS — F419 Anxiety disorder, unspecified: Secondary | ICD-10-CM | POA: Diagnosis not present

## 2021-10-25 DIAGNOSIS — Z30019 Encounter for initial prescription of contraceptives, unspecified: Secondary | ICD-10-CM

## 2021-10-25 LAB — POCT URINE PREGNANCY: Preg Test, Ur: NEGATIVE

## 2021-10-25 MED ORDER — VENLAFAXINE HCL ER 37.5 MG PO CP24: 37.5000 mg | ORAL_CAPSULE | Freq: Every day | ORAL | 11 refills | Status: DC

## 2021-10-25 MED ORDER — NORETHIN ACE-ETH ESTRAD-FE 1-20 MG-MCG(24) PO TABS: 1.0000 | ORAL_TABLET | Freq: Every day | ORAL | 4 refills | Status: DC

## 2021-10-25 NOTE — Progress Notes (Signed)
POSTPARTUM VISIT Patient name: Miranda Campbell MRN [id number]  Date of birth: [birth year] Chief Complaint:   Postpartum Care (Want birth control pill)  History of Present Illness:   Miranda Campbell is a 26 y.o. G7P2002 female being seen today for a postpartum visit. She is 5 weeks postpartum following a spontaneous vaginal delivery at [redacted]w[redacted]d gestational weeks. IOL: No  Pregnancy uncomplicated.  Last pap smear: 02/2021 neg   Anxiety: Bad reaction to generic lexapro.   Postpartum course has been uncomplicated.  Bleeding no bleeding. Bowel function is normal. Bladder function is normal. Urinary incontinence? No, fecal incontinence? No Patient is not sexually active. Last sexual activity: prior to delivery.   Desired contraception: OCPs. Patient does not want a pregnancy in the future.   Desired family size is 2 children.  Husband considering vasectomy   Upstream - 10/25/21 0950       Pregnancy Intention Screening   Does the patient want to become pregnant in the next year? No    Does the patient's partner want to become pregnant in the next year? No    Would the patient like to discuss contraceptive options today? Yes      Contraception Wrap Up   Contraception Counseling Provided Yes            The pregnancy intention screening data noted above was reviewed. Potential methods of contraception were discussed. The patient elected to proceed with No data recorded.  Edinburgh Postpartum Depression Screening: Positive 16/30  Edinburgh Postnatal Depression Scale - 10/25/21 0946       Edinburgh Postnatal Depression Scale:  In the Past 7 Days   I have been able to laugh and see the funny side of things. 1    I have looked forward with enjoyment to things. 1    I have blamed myself unnecessarily when things went wrong. 3    I have been anxious or worried for no good reason. 2    I have felt scared or panicky for no good reason. 2    Things have been getting on top of me. 2    I  have been so unhappy that I have had difficulty sleeping. 1    I have felt sad or miserable. 2    I have been so unhappy that I have been crying. 1    The thought of harming myself has occurred to me. 1    Edinburgh Postnatal Depression Scale Total 16             Baby's course has been uncomplicated. Baby is feeding by bottle. Infant has a pediatrician/family doctor? Yes.  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  Pt has material needs met for her and baby: Yes.    Review of Systems:   Pertinent items are noted in HPI Denies Abnormal vaginal discharge w/ itching/odor/irritation, headaches, visual changes, shortness of breath, chest pain, abdominal pain, severe nausea/vomiting, or problems with urination or bowel movements. Pertinent History Reviewed:  Reviewed past medical,surgical, obstetrical and family history.  Reviewed problem list, medications and allergies. OB History  Gravida Para Term Preterm AB Living  $Remov'2 2 2     2  'fIiggv$ SAB IAB Ectopic Multiple Live Births        0 2    # Outcome Date GA Lbr Len/2nd Weight Sex Delivery Anes PTL Lv  2 Term 09/21/21 [redacted]w[redacted]d 07:00 / 00:07 8 lb 2.5 oz (3.7 kg) F Vag-Spont EPI  LIV  Birth Comments: WNL  1 Term 09/02/18 [redacted]w[redacted]d 15:08 / 01:42 8 lb 1.6 oz (3.674 kg) F Vag-Spont EPI N LIV   Physical Assessment:   Vitals:   10/25/21 0939  BP: 117/75  Pulse: 67  Weight: 204 lb (92.5 kg)  Height: $Remove'5\' 3"'dUIGUvS$  (1.6 m)  Body mass index is 36.14 kg/m.       Physical Examination:   General appearance: alert, well appearing, and in no distress  Mental status: normal mood, behavior, speech, dress, motor activity, and thought processes  Skin: warm & dry   Cardiovascular: normal heart rate noted   Respiratory: normal respiratory effort, no distress   Breasts: deferred, no complaints   Abdomen: soft, non-tender   Pelvic: normal external genitalia, vulva, vagina, cervix, uterus and adnexa  Rectal: no hemorrhoids  Extremities: no  edema  Chaperone:  Dr. Nita Sells          Results for orders placed or performed in visit on 10/25/21 (from the past 24 hour(s))  POCT urine pregnancy   Collection Time: 10/25/21  9:55 AM  Result Value Ref Range   Preg Test, Ur Negative Negative    Assessment & Plan:  1) Postpartum exam 2) 5 wks s/p spontaneous vaginal delivery 3) bottle feeding 4) Depression screening 5) Contraception management: plan to start on OCPs daily OCP risk assessment: Pt denies personal history of VTE, stroke or heart attack.  Denies personal h/o breast cancer.  Pt is either a non-smoker or smoker under the age of 26yo.  Denies h/o migraines with aura  Essential components of care per ACOG recommendations:  1.  Mood and well being:  Reviewed screening- pt states she has information regarding emergency contacts Encouraged pt to reach out to therapist Previously on medication, interested in restarting- Rx sent in for Effexor, f/u in 3-104mos  2. Infant care and feeding:  If breastfeeding, discussed returning to work, pumping, breastfeeding-associated pain, guidance regarding return to fertility while lactating if not using another method Recommended that all caregivers be immunized for flu, pertussis and other preventable communicable diseases  3. Sexuality, contraception and birth spacing Provided guidance regarding sexuality, management of dyspareunia, and resumption of intercourse Discussed avoiding interpregnancy interval <35mths and recommended birth spacing of 18 months  4. Sleep and fatigue Discussed coping options for fatigue and sleep disruption Encouraged family/partner/community support of 4 hrs of uninterrupted sleep to help with mood and fatigue  5. Physical recovery  If pt had a C/S, assessed incisional pain and providing guidance on normal vs prolonged recovery If pt had a laceration, perineal healing and pain reviewed.  If urinary or fecal incontinence, discussed management and referred to  PT or uro/gyn if indicated  Patient is safe to resume physical activity. Discussed attainment of healthy weight.   Meds:  Meds ordered this encounter  Medications   venlafaxine XR (EFFEXOR XR) 37.5 MG 24 hr capsule    Sig: Take 1 capsule (37.5 mg total) by mouth daily with breakfast.    Dispense:  30 capsule    Refill:  11   Norethindrone Acetate-Ethinyl Estrad-FE (LOESTRIN 24 FE) 1-20 MG-MCG(24) tablet    Sig: Take 1 tablet by mouth daily.    Dispense:  90 tablet    Refill:  4    Follow-up: Return in about 3 months (around 01/25/2022) for Medication follow up.   Orders Placed This Encounter  Procedures   POCT urine pregnancy    Janyth Pupa, DO Attending [place], [facility] for [employer],  [place]

## 2022-01-24 ENCOUNTER — Ambulatory Visit: Payer: 59 | Admitting: Women's Health

## 2022-01-30 ENCOUNTER — Ambulatory Visit (INDEPENDENT_AMBULATORY_CARE_PROVIDER_SITE_OTHER): Payer: 59 | Admitting: Women's Health

## 2022-01-30 ENCOUNTER — Encounter: Payer: Self-pay | Admitting: Women's Health

## 2022-01-30 VITALS — BP 131/89 | HR 73 | Wt 210.0 lb

## 2022-01-30 DIAGNOSIS — F419 Anxiety disorder, unspecified: Secondary | ICD-10-CM

## 2022-01-30 DIAGNOSIS — Z3041 Encounter for surveillance of contraceptive pills: Secondary | ICD-10-CM | POA: Diagnosis not present

## 2022-01-30 NOTE — Progress Notes (Signed)
GYN VISIT Patient name: Miranda Campbell MRN [id number]  Date of birth: 12-Jan-1996 Chief Complaint:   Follow-up  History of Present Illness:   Miranda Campbell is a 26 y.o. G57P2002 Caucasian female being seen today for f/u on Loestrin rx'd 5/23 and effexor 37.5mg  rx'd 5/23 for anxiety. Currently s/p SVB, bottlefeeding. EPDS on 5/23=16, Today=9. Feels much better on effexor, husband has also noticed big improvement, no longer wants to 'run away'.  Doing well on COCs as well.  Patient's last menstrual period was 01/15/2022. The current method of family planning is OCP (estrogen/progesterone).  Last pap 03/04/21. Results were: NILM w/ HRHPV negative     01/30/2022    1:56 PM 06/20/2021    9:05 AM 03/04/2021   10:25 AM 01/28/2018    3:01 PM  Depression screen PHQ 2/9  Decreased Interest 0 1 1 3   Down, Depressed, Hopeless 0 1 0 1  PHQ - 2 Score 0 2 1 4   Altered sleeping 3 2 0 0  Tired, decreased energy 3 2 2 2   Change in appetite 1 1 2 2   Feeling bad or failure about yourself  1 0 0 0  Trouble concentrating 0 0 0 0  Moving slowly or fidgety/restless 0 0 0 0  Suicidal thoughts 0 0 0 0  PHQ-9 Score 8 7 5 8   Difficult doing work/chores Somewhat difficult   Not difficult at all        01/30/2022    2:07 PM 06/20/2021    9:05 AM 03/04/2021   10:29 AM  GAD 7 : Generalized Anxiety Score  Nervous, Anxious, on Edge 1 1 2   Control/stop worrying 0 0 1  Worry too much - different things 1 1 2   Trouble relaxing 0 1 1  Restless 0 0 1  Easily annoyed or irritable 1 1 2   Afraid - awful might happen 1 1 2   Total GAD 7 Score 4 5 11   Anxiety Difficulty Not difficult at all       Review of Systems:   Pertinent items are noted in HPI Denies fever/chills, dizziness, headaches, visual disturbances, fatigue, shortness of breath, chest pain, abdominal pain, vomiting, abnormal vaginal discharge/itching/odor/irritation, problems with periods, bowel movements, urination, or intercourse unless  otherwise stated above.  Pertinent History Reviewed:  Reviewed past medical,surgical, social, obstetrical and family history.  Reviewed problem list, medications and allergies. Physical Assessment:   Vitals:   01/30/22 1348  BP: 131/89  Pulse: 73  Weight: 210 lb (95.3 kg)  Body mass index is 37.2 kg/m.       Physical Examination:   General appearance: alert, well appearing, and in no distress  Mental status: alert, oriented to person, place, and time  Skin: warm & dry   Cardiovascular: normal heart rate noted  Respiratory: normal respiratory effort, no distress  Abdomen: soft, non-tender   Pelvic: examination not indicated  Extremities: no edema   Chaperone: N/A    No results found for this or any previous visit (from the past 24 hour(s)).  Assessment & Plan:  1) Contraception surveillance> doing well on Loestrin, bp borderline, to check at home- if >140/90, let know  2) Anxiety> doing well on effexor 37.5mg , continue current dosage  Meds: No orders of the defined types were placed in this encounter.   No orders of the defined types were placed in this encounter.   Return in about 1 year (around 01/31/2023) for Physical.  06/22/2021 CNM, [facility] 01/30/2022 2:21 PM

## 2022-10-11 ENCOUNTER — Other Ambulatory Visit: Payer: Self-pay | Admitting: *Deleted

## 2022-10-11 DIAGNOSIS — F419 Anxiety disorder, unspecified: Secondary | ICD-10-CM

## 2022-10-16 MED ORDER — VENLAFAXINE HCL ER 37.5 MG PO CP24: 37.5000 mg | ORAL_CAPSULE | Freq: Every day | ORAL | 0 refills | Status: DC

## 2022-10-27 ENCOUNTER — Encounter: Payer: Self-pay | Admitting: Adult Health

## 2022-10-27 ENCOUNTER — Ambulatory Visit: Payer: Managed Care, Other (non HMO) | Admitting: Adult Health

## 2022-10-27 VITALS — BP 122/76 | HR 88 | Ht 63.0 in | Wt 220.0 lb

## 2022-10-27 DIAGNOSIS — Z3041 Encounter for surveillance of contraceptive pills: Secondary | ICD-10-CM | POA: Diagnosis not present

## 2022-10-27 DIAGNOSIS — F419 Anxiety disorder, unspecified: Secondary | ICD-10-CM

## 2022-10-27 DIAGNOSIS — Z01419 Encounter for gynecological examination (general) (routine) without abnormal findings: Secondary | ICD-10-CM | POA: Diagnosis not present

## 2022-10-27 MED ORDER — VENLAFAXINE HCL ER 37.5 MG PO CP24: 37.5000 mg | ORAL_CAPSULE | Freq: Every day | ORAL | 3 refills | Status: DC

## 2022-10-27 MED ORDER — HAILEY 24 FE 1-20 MG-MCG(24) PO TABS: 1.0000 | ORAL_TABLET | Freq: Every day | ORAL | 4 refills | Status: DC

## 2022-10-27 NOTE — Progress Notes (Signed)
Patient ID: Miranda Campbell, female   DOB: 01/11/1996, 27 y.o.   MRN: [id number] History of Present Illness: Miranda Campbell is a 27 year old white female, married, G2P2 in for a well woman gyn exam.  Last pap was negative HPV,NILM 03/04/21   Current Medications, Allergies, Past Medical History, Past Surgical History, Family History and Social History were reviewed in [employer] [facility] record.     Review of Systems: Patient denies any headaches, hearing loss, fatigue, blurred vision, shortness of breath, chest pain, abdominal pain, problems with bowel movements, urination, or intercourse. No joint pain or mood swings.  Happy with birth control Feels much better with Effexor XR   Physical Exam:BP 122/76 (BP Location: Left Arm, Patient Position: Sitting, Cuff Size: Large)   Pulse 88   Ht 5\' 3"  (1.6 m)   Wt 220 lb (99.8 kg)   LMP 10/22/2022   Breastfeeding No   BMI 38.97 kg/m   General:  Well developed, well nourished, no acute distress Skin:  Warm and dry Neck:  Midline trachea, normal thyroid, good ROM, no lymphadenopathy Lungs; Clear to auscultation bilaterally Breast:  No dominant palpable mass, retraction, or nipple discharge Cardiovascular: Regular rate and rhythm Abdomen:  Soft, non tender, no hepatosplenomegaly Pelvic:  External genitalia is normal in appearance, no lesions.  The vagina is normal in appearance. Urethra has no lesions or masses. The cervix is bulbous.  Uterus is felt to be normal size, shape, and contour.  No adnexal masses or tenderness noted.Bladder is non tender, no masses felt. Extremities/musculoskeletal:  No swelling or varicosities noted, no clubbing or cyanosis Psych:  No mood changes, alert and cooperative,seems happy AA is 2 Fall risk is low    10/27/2022    9:45 AM 01/30/2022    1:56 PM 06/20/2021    9:05 AM  Depression screen PHQ 2/9  Decreased Interest 1 0 1  Down, Depressed, Hopeless 1 0 1  PHQ - 2 Score 2 0 2  Altered sleeping 2 3 2    Tired, decreased energy 1 3 2   Change in appetite 0 1 1  Feeling bad or failure about yourself  1 1 0  Trouble concentrating 1 0 0  Moving slowly or fidgety/restless 0 0 0  Suicidal thoughts 0 0 0  PHQ-9 Score 7 8 7   Difficult doing work/chores  Somewhat difficult        10/27/2022    9:45 AM 01/30/2022    2:07 PM 06/20/2021    9:05 AM 03/04/2021   10:29 AM  GAD 7 : Generalized Anxiety Score  Nervous, Anxious, on Edge 1 1 1 2   Control/stop worrying 0 0 0 1  Worry too much - different things 1 1 1 2   Trouble relaxing 0 0 1 1  Restless 0 0 0 1  Easily annoyed or irritable 1 1 1 2   Afraid - awful might happen 1 1 1 2   Total GAD 7 Score 4 4 5 11   Anxiety Difficulty  Not difficult at all        Upstream - 10/27/22 0943       Pregnancy Intention Screening   Does the patient want to become pregnant in the next year? No    Does the patient's partner want to become pregnant in the next year? No    Would the patient like to discuss contraceptive options today? No      Contraception Wrap Up   Current Method Oral Contraceptive    End Method Oral Contraceptive  Examination chaperoned by Malachy Mood LPN   Impression and Plan: 1. Encounter for well woman exam with routine gynecological exam Pap and physical in 1 year  2. Encounter for surveillance of contraceptive pills Happy with pills, will refill   Meds ordered this encounter  Medications   Norethindrone Acetate-Ethinyl Estrad-FE (HAILEY 24 FE) 1-20 MG-MCG(24) tablet    Sig: Take 1 tablet by mouth daily.    Dispense:  84 tablet    Refill:  4    Order Specific Question:   Supervising Provider    Answer:   Duane Lope H [[id number]]   venlafaxine XR (EFFEXOR XR) 37.5 MG 24 hr capsule    Sig: Take 1 capsule (37.5 mg total) by mouth daily with breakfast.    Dispense:  90 capsule    Refill:  3    Order Specific Question:   Supervising Provider    Answer:   Despina Hidden, LUTHER H [[id number]]    3. Anxiety Feels better on  Effexor XR will continue

## 2023-11-14 ENCOUNTER — Ambulatory Visit: Admitting: Adult Health

## 2023-11-14 ENCOUNTER — Encounter: Payer: Self-pay | Admitting: Adult Health

## 2023-11-14 ENCOUNTER — Other Ambulatory Visit (HOSPITAL_COMMUNITY)
Admission: RE | Admit: 2023-11-14 | Discharge: 2023-11-14 | Disposition: A | Discharge status: Home / Self Care | Source: Ambulatory Visit | Attending: Adult Health | Admitting: Adult Health

## 2023-11-14 VITALS — BP 127/86 | HR 89 | Ht 63.0 in | Wt 227.5 lb

## 2023-11-14 DIAGNOSIS — Z01419 Encounter for gynecological examination (general) (routine) without abnormal findings: Secondary | ICD-10-CM

## 2023-11-14 DIAGNOSIS — Z1331 Encounter for screening for depression: Secondary | ICD-10-CM

## 2023-11-14 DIAGNOSIS — F419 Anxiety disorder, unspecified: Secondary | ICD-10-CM

## 2023-11-14 DIAGNOSIS — Z3041 Encounter for surveillance of contraceptive pills: Secondary | ICD-10-CM

## 2023-11-14 MED ORDER — VENLAFAXINE HCL ER 37.5 MG PO CP24: 37.5000 mg | ORAL_CAPSULE | Freq: Every day | ORAL | 3 refills | Status: AC

## 2023-11-14 MED ORDER — HAILEY 24 FE 1-20 MG-MCG(24) PO TABS: 1.0000 | ORAL_TABLET | Freq: Every day | ORAL | 4 refills | Status: AC

## 2023-11-14 NOTE — Progress Notes (Signed)
 Patient ID: Linzi Ohlinger, female   DOB: 1995-10-27, 28 y.o.   MRN: [id number] History of Present Illness: Breta is a 28 year old white female, married, G2P2002, in for a well woman gyn exam and pap. She is happy with BCP and effexor  XR.    Current Medications, Allergies, Past Medical History, Past Surgical History, Family History and Social History were reviewed in [employer] [facility] record.     Review of Systems: Patient denies any headaches, hearing loss, fatigue, blurred vision, shortness of breath, chest pain, abdominal pain, problems with bowel movements, urination, or intercourse. No joint pain or mood swings.     Physical Exam:BP 127/86 (BP Location: Left Arm, Patient Position: Sitting, Cuff Size: Large)   Pulse 89   Ht 5' 3 (1.6 m)   Wt 227 lb 8 oz (103.2 kg)   LMP 10/26/2023 (Approximate)   BMI 40.30 kg/m   General:  Well developed, well nourished, no acute distress Skin:  Warm and dry Neck:  Midline trachea, normal thyroid, good ROM, no lymphadenopathy Lungs; Clear to auscultation bilaterally Breast:  No dominant palpable mass, retraction, or nipple discharge Cardiovascular: Regular rate and rhythm Abdomen:  Soft, non tender, no hepatosplenomegaly Pelvic:  External genitalia is normal in appearance, no lesions.  The vagina is normal in appearance. Urethra has no lesions or masses. The cervix is bulbous,pap with HR HPV genotyping performed.  Uterus is felt to be normal size, shape, and contour.  No adnexal masses or tenderness noted.Bladder is non tender, no masses felt. Extremities/musculoskeletal:  No swelling or varicosities noted, no clubbing or cyanosis Psych:  No mood changes, alert and cooperative,seems happy AA is 2 Fall risk is low    11/14/2023   10:37 AM 10/27/2022    9:45 AM 01/30/2022    1:56 PM  Depression screen PHQ 2/9  Decreased Interest 1 1 0  Down, Depressed, Hopeless 0 1 0  PHQ - 2 Score 1 2 0  Altered sleeping 1 2 3   Tired,  decreased energy 1 1 3   Change in appetite 0 0 1  Feeling bad or failure about yourself  0 1 1  Trouble concentrating 0 1 0  Moving slowly or fidgety/restless 0 0 0  Suicidal thoughts 0 0 0  PHQ-9 Score 3 7 8   Difficult doing work/chores   Somewhat difficult       11/14/2023   10:37 AM 10/27/2022    9:45 AM 01/30/2022    2:07 PM 06/20/2021    9:05 AM  GAD 7 : Generalized Anxiety Score  Nervous, Anxious, on Edge 1 1 1 1   Control/stop worrying 1 0 0 0  Worry too much - different things 1 1 1 1   Trouble relaxing 0 0 0 1  Restless 0 0 0 0  Easily annoyed or irritable 1 1 1 1   Afraid - awful might happen 0 1 1 1   Total GAD 7 Score 4 4 4 5   Anxiety Difficulty   Not difficult at all       Upstream - 11/14/23 1036       Pregnancy Intention Screening   Does the patient want to become pregnant in the next year? No    Does the patient's partner want to become pregnant in the next year? No    Would the patient like to discuss contraceptive options today? No      Contraception Wrap Up   Current Method Oral Contraceptive    End Method Oral Contraceptive  Contraception Counseling Provided Yes            Examination chaperoned by Alphonso Aschoff LPN  Impression and plan: 1. Encounter for gynecological examination with Papanicolaou smear of cervix (Primary) Pap sent Pap in 3 years if normal Physical and labs in 1 year  - Cytology - PAP( [facility])  2. Encounter for surveillance of contraceptive pills Happy with Hailey  24 FE will refill  3. Anxiety Happy with effexor  will refill  Meds ordered this encounter  Medications   Norethindrone Acetate-Ethinyl Estrad-FE (HAILEY  24 FE) 1-20 MG-MCG(24) tablet    Sig: Take 1 tablet by mouth daily.    Dispense:  84 tablet    Refill:  4    Supervising Provider:   Evalyn Hillier H [[id number]]   venlafaxine  XR (EFFEXOR  XR) 37.5 MG 24 hr capsule    Sig: Take 1 capsule (37.5 mg total) by mouth daily with breakfast.    Dispense:  90 capsule     Refill:  3    Supervising Provider:   Evalyn Hillier H [[id number]]

## 2023-11-19 ENCOUNTER — Ambulatory Visit: Payer: Self-pay | Admitting: Adult Health

## 2023-11-19 LAB — CYTOLOGY - PAP
Comment: NEGATIVE
Diagnosis: NEGATIVE
High risk HPV: NEGATIVE
